# Patient Record
Sex: Female | Born: 1980 | Race: White | Hispanic: Yes | Marital: Single | Smoking: Former smoker
Health system: Southern US, Community
[De-identification: ages and names within clinical notes are randomized; demographics above are authoritative.]

## PROBLEM LIST (undated history)

## (undated) ENCOUNTER — Emergency Department (HOSPITAL_COMMUNITY): Payer: Medicaid Other | Source: Home / Self Care

## (undated) DIAGNOSIS — E7800 Pure hypercholesterolemia, unspecified: Secondary | ICD-10-CM

## (undated) DIAGNOSIS — I10 Essential (primary) hypertension: Secondary | ICD-10-CM

## (undated) DIAGNOSIS — A048 Other specified bacterial intestinal infections: Secondary | ICD-10-CM

## (undated) DIAGNOSIS — E781 Pure hyperglyceridemia: Secondary | ICD-10-CM

## (undated) DIAGNOSIS — T7840XA Allergy, unspecified, initial encounter: Secondary | ICD-10-CM

## (undated) HISTORY — PX: OTHER SURGICAL HISTORY: SHX169

## (undated) HISTORY — DX: Other specified bacterial intestinal infections: A04.8

## (undated) HISTORY — DX: Allergy, unspecified, initial encounter: T78.40XA

## (undated) HISTORY — DX: Pure hyperglyceridemia: E78.1

---

## 2003-12-01 ENCOUNTER — Emergency Department (HOSPITAL_COMMUNITY): Admission: EM | Admit: 2003-12-01 | Discharge: 2003-12-01 | Payer: Self-pay | Admitting: Emergency Medicine

## 2008-05-10 ENCOUNTER — Emergency Department (HOSPITAL_COMMUNITY): Admission: EM | Admit: 2008-05-10 | Discharge: 2008-05-10 | Payer: Self-pay | Admitting: Emergency Medicine

## 2010-03-05 ENCOUNTER — Inpatient Hospital Stay (HOSPITAL_COMMUNITY)
Admission: AD | Admit: 2010-03-05 | Discharge: 2010-03-05 | Payer: Self-pay | Source: Home / Self Care | Admitting: Family Medicine

## 2010-03-21 ENCOUNTER — Inpatient Hospital Stay (HOSPITAL_COMMUNITY)
Admission: RE | Admit: 2010-03-21 | Discharge: 2010-03-21 | Payer: Self-pay | Source: Home / Self Care | Admitting: Obstetrics & Gynecology

## 2010-03-31 ENCOUNTER — Ambulatory Visit: Payer: Self-pay | Admitting: Obstetrics & Gynecology

## 2010-03-31 LAB — CONVERTED CEMR LAB
Antibody Screen: NEGATIVE
Rh Type: POSITIVE

## 2010-04-01 ENCOUNTER — Encounter: Payer: Self-pay | Admitting: Obstetrics & Gynecology

## 2010-04-01 LAB — CONVERTED CEMR LAB

## 2010-04-14 ENCOUNTER — Ambulatory Visit: Payer: Self-pay | Admitting: Family Medicine

## 2010-04-18 ENCOUNTER — Ambulatory Visit (HOSPITAL_COMMUNITY)
Admission: RE | Admit: 2010-04-18 | Discharge: 2010-04-18 | Payer: Self-pay | Source: Home / Self Care | Attending: Obstetrics & Gynecology | Admitting: Obstetrics & Gynecology

## 2010-05-05 ENCOUNTER — Ambulatory Visit: Payer: Self-pay | Admitting: Obstetrics and Gynecology

## 2010-05-19 ENCOUNTER — Ambulatory Visit
Admission: RE | Admit: 2010-05-19 | Discharge: 2010-05-19 | Payer: Self-pay | Source: Home / Self Care | Attending: Obstetrics & Gynecology | Admitting: Obstetrics & Gynecology

## 2010-05-21 LAB — POCT URINALYSIS DIPSTICK
Bilirubin Urine: NEGATIVE
Hgb urine dipstick: NEGATIVE
Ketones, ur: NEGATIVE mg/dL
Nitrite: NEGATIVE
Protein, ur: NEGATIVE mg/dL
Specific Gravity, Urine: 1.025 (ref 1.005–1.030)
Urine Glucose, Fasting: NEGATIVE mg/dL
Urobilinogen, UA: 0.2 mg/dL (ref 0.0–1.0)
pH: 7 (ref 5.0–8.0)

## 2010-06-02 ENCOUNTER — Encounter (INDEPENDENT_AMBULATORY_CARE_PROVIDER_SITE_OTHER): Payer: Self-pay | Admitting: *Deleted

## 2010-06-02 ENCOUNTER — Ambulatory Visit
Admission: RE | Admit: 2010-06-02 | Discharge: 2010-06-02 | Payer: Self-pay | Source: Home / Self Care | Attending: Obstetrics and Gynecology | Admitting: Obstetrics and Gynecology

## 2010-06-02 DIAGNOSIS — O26879 Cervical shortening, unspecified trimester: Secondary | ICD-10-CM

## 2010-06-02 LAB — POCT URINALYSIS DIPSTICK
Bilirubin Urine: NEGATIVE
Hgb urine dipstick: NEGATIVE
Ketones, ur: NEGATIVE mg/dL
Nitrite: NEGATIVE
Protein, ur: NEGATIVE mg/dL
Specific Gravity, Urine: 1.02 (ref 1.005–1.030)
Urine Glucose, Fasting: NEGATIVE mg/dL
Urobilinogen, UA: 0.2 mg/dL (ref 0.0–1.0)
pH: 7 (ref 5.0–8.0)

## 2010-06-02 LAB — CONVERTED CEMR LAB
Chlamydia, DNA Probe: NEGATIVE
GC Probe Amp, Genital: NEGATIVE
Trich, Wet Prep: NONE SEEN
Yeast Wet Prep HPF POC: NONE SEEN

## 2010-06-09 ENCOUNTER — Other Ambulatory Visit: Payer: Self-pay

## 2010-06-09 DIAGNOSIS — O26879 Cervical shortening, unspecified trimester: Secondary | ICD-10-CM

## 2010-06-09 DIAGNOSIS — IMO0002 Reserved for concepts with insufficient information to code with codable children: Secondary | ICD-10-CM

## 2010-06-10 ENCOUNTER — Inpatient Hospital Stay (HOSPITAL_COMMUNITY)
Admission: AD | Admit: 2010-06-10 | Discharge: 2010-06-13 | DRG: 775 | Disposition: A | Discharge status: Home / Self Care | Payer: Medicaid Other | Source: Ambulatory Visit | Attending: Obstetrics & Gynecology | Admitting: Obstetrics & Gynecology

## 2010-06-10 DIAGNOSIS — O4290 Premature rupture of membranes, unspecified as to length of time between rupture and onset of labor, unspecified weeks of gestation: Principal | ICD-10-CM | POA: Diagnosis present

## 2010-06-10 LAB — CBC
HCT: 38.2 % (ref 36.0–46.0)
Hemoglobin: 13.2 g/dL (ref 12.0–15.0)
MCH: 29.7 pg (ref 26.0–34.0)
MCHC: 34.6 g/dL (ref 30.0–36.0)
MCV: 86 fL (ref 78.0–100.0)
Platelets: 223 10*3/uL (ref 150–400)
RBC: 4.44 MIL/uL (ref 3.87–5.11)
RDW: 13.8 % (ref 11.5–15.5)
WBC: 9 10*3/uL (ref 4.0–10.5)

## 2010-06-11 DIAGNOSIS — O4290 Premature rupture of membranes, unspecified as to length of time between rupture and onset of labor, unspecified weeks of gestation: Secondary | ICD-10-CM

## 2010-06-11 LAB — RPR: RPR Ser Ql: NONREACTIVE

## 2010-06-11 LAB — HEPATITIS B SURFACE ANTIGEN: Hepatitis B Surface Ag: NEGATIVE

## 2010-07-09 ENCOUNTER — Encounter: Payer: Self-pay | Admitting: Obstetrics and Gynecology

## 2010-07-09 ENCOUNTER — Ambulatory Visit (INDEPENDENT_AMBULATORY_CARE_PROVIDER_SITE_OTHER): Payer: Self-pay | Admitting: Obstetrics and Gynecology

## 2010-07-09 LAB — CONVERTED CEMR LAB
HCT: 42.1 % (ref 36.0–46.0)
Hemoglobin: 14.6 g/dL (ref 12.0–15.0)
MCHC: 34.7 g/dL (ref 30.0–36.0)
MCV: 83.9 fL (ref 78.0–100.0)
Platelets: 301 10*3/uL (ref 150–400)
RBC: 5.02 M/uL (ref 3.87–5.11)
RDW: 13.3 % (ref 11.5–15.5)
WBC: 8.9 10*3/uL (ref 4.0–10.5)

## 2010-07-14 LAB — POCT URINALYSIS DIPSTICK
Bilirubin Urine: NEGATIVE
Glucose, UA: NEGATIVE mg/dL
Ketones, ur: NEGATIVE mg/dL
Nitrite: NEGATIVE
Protein, ur: NEGATIVE mg/dL
Specific Gravity, Urine: 1.025 (ref 1.005–1.030)
Urobilinogen, UA: 0.2 mg/dL (ref 0.0–1.0)
pH: 7 (ref 5.0–8.0)

## 2010-07-15 LAB — URINALYSIS, ROUTINE W REFLEX MICROSCOPIC
Bilirubin Urine: NEGATIVE
Glucose, UA: NEGATIVE mg/dL
Hgb urine dipstick: NEGATIVE
Ketones, ur: NEGATIVE mg/dL
Nitrite: NEGATIVE
Protein, ur: NEGATIVE mg/dL
Specific Gravity, Urine: 1.01 (ref 1.005–1.030)
Urobilinogen, UA: 0.2 mg/dL (ref 0.0–1.0)
pH: 7 (ref 5.0–8.0)

## 2010-07-15 LAB — POCT URINALYSIS DIPSTICK
Bilirubin Urine: NEGATIVE
Bilirubin Urine: NEGATIVE
Glucose, UA: NEGATIVE mg/dL
Glucose, UA: NEGATIVE mg/dL
Hgb urine dipstick: NEGATIVE
Hgb urine dipstick: NEGATIVE
Ketones, ur: NEGATIVE mg/dL
Ketones, ur: NEGATIVE mg/dL
Nitrite: NEGATIVE
Nitrite: NEGATIVE
Protein, ur: NEGATIVE mg/dL
Protein, ur: NEGATIVE mg/dL
Specific Gravity, Urine: 1.025 (ref 1.005–1.030)
Specific Gravity, Urine: 1.02 (ref 1.005–1.030)
Urobilinogen, UA: 0.2 mg/dL (ref 0.0–1.0)
Urobilinogen, UA: 0.2 mg/dL (ref 0.0–1.0)
pH: 6 (ref 5.0–8.0)
pH: 7 (ref 5.0–8.0)

## 2010-07-15 LAB — WET PREP, GENITAL
Trich, Wet Prep: NONE SEEN
Yeast Wet Prep HPF POC: NONE SEEN

## 2010-07-15 LAB — FETAL FIBRONECTIN: Fetal Fibronectin: NEGATIVE

## 2012-11-04 IMAGING — US US OB TRANSVAGINAL
1 series · 10 of 10 positions shown · non-contrast
Comparison: none

[Series 1: us ob transvaginal · 10 of 10 slices shown]
[im 1/10]
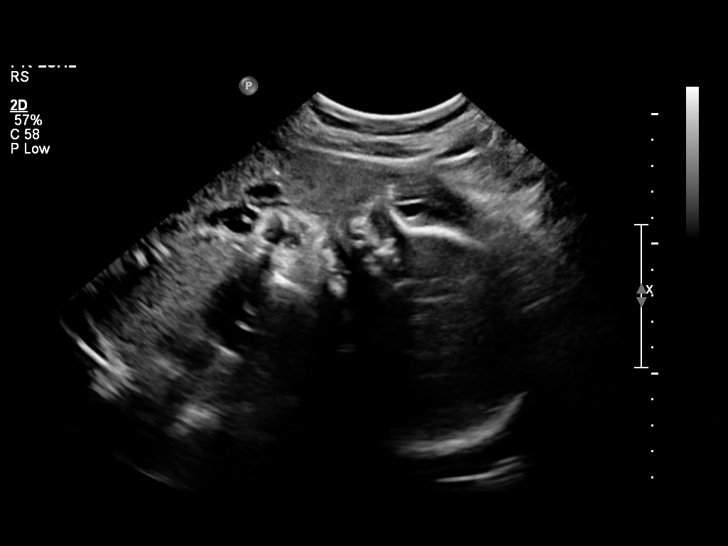
[im 2/10]
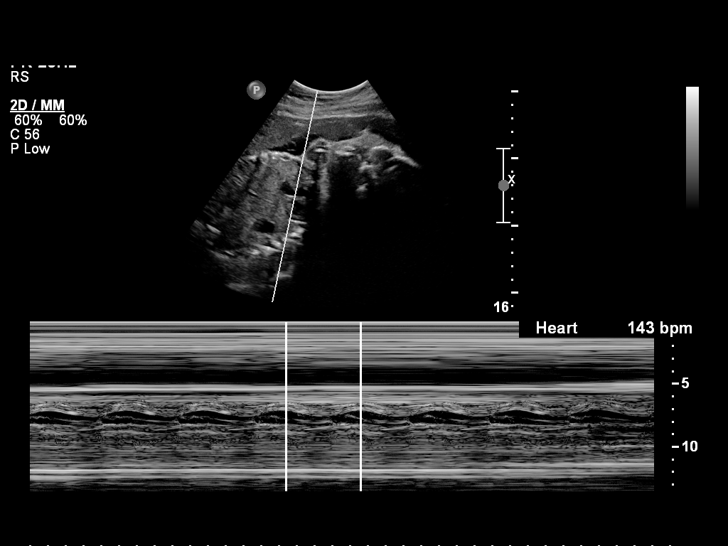
[im 3/10]
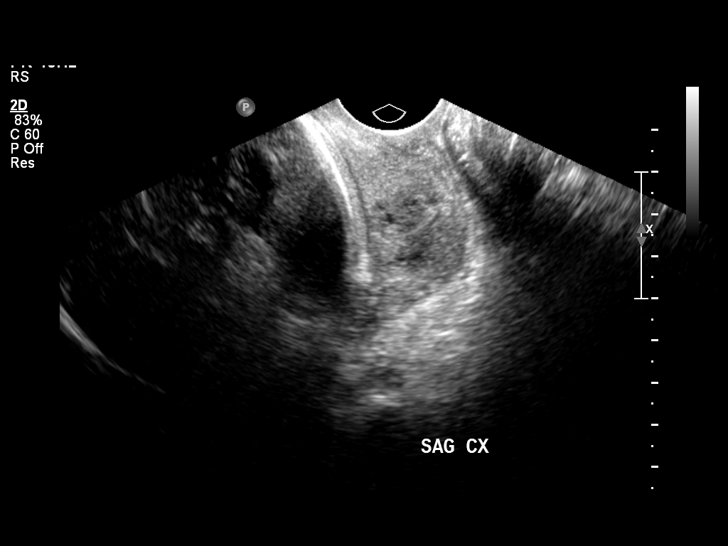
[im 4/10]
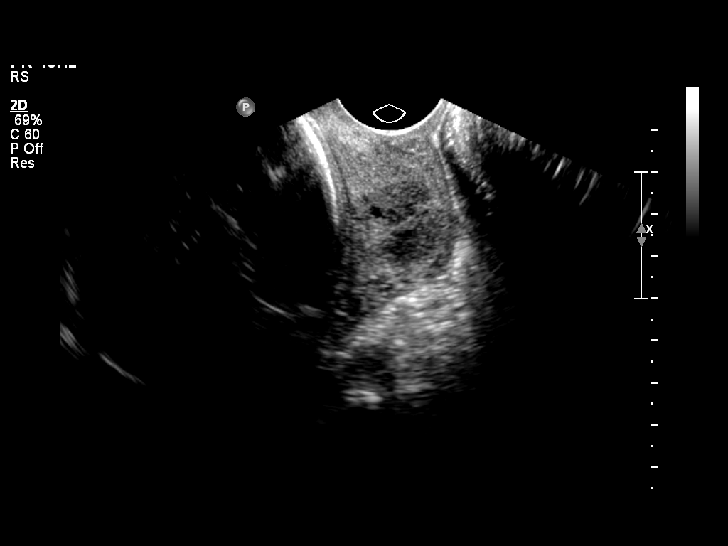
[im 5/10]
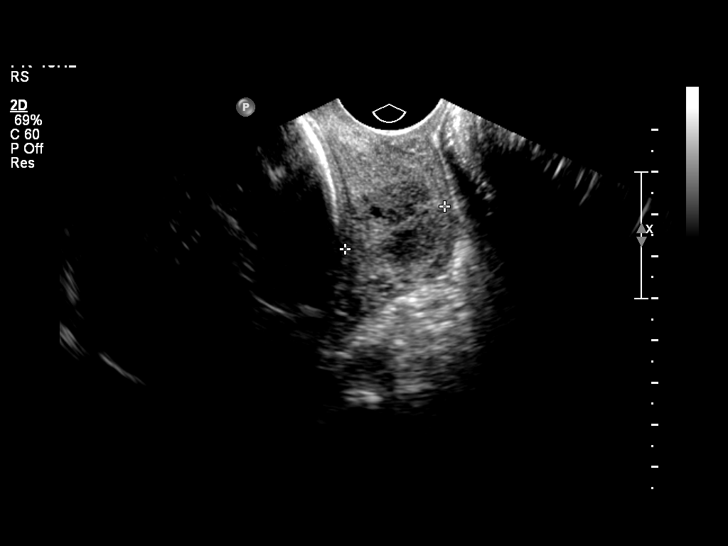
[im 6/10]
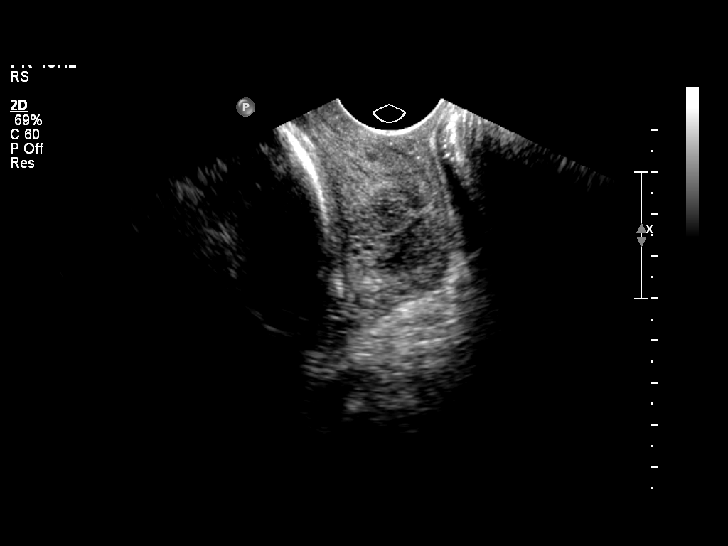
[im 7/10]
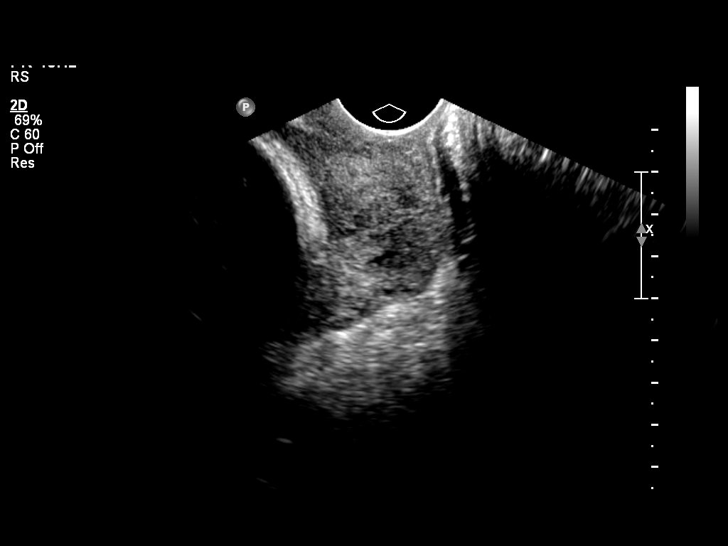
[im 8/10]
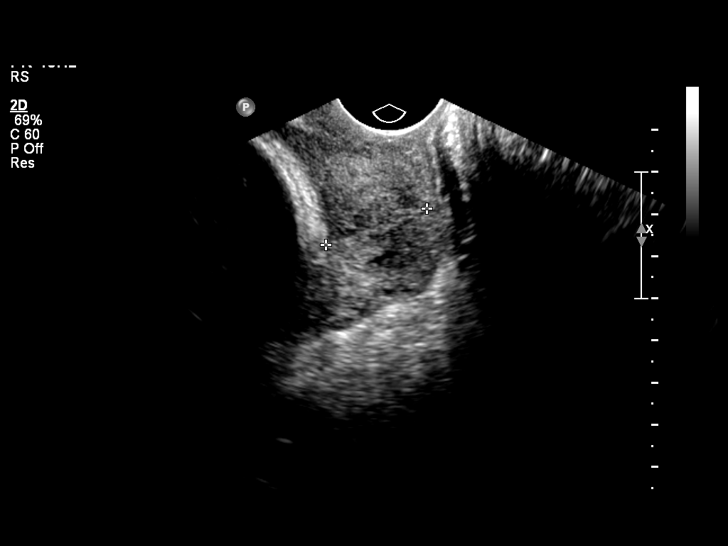
[im 9/10]
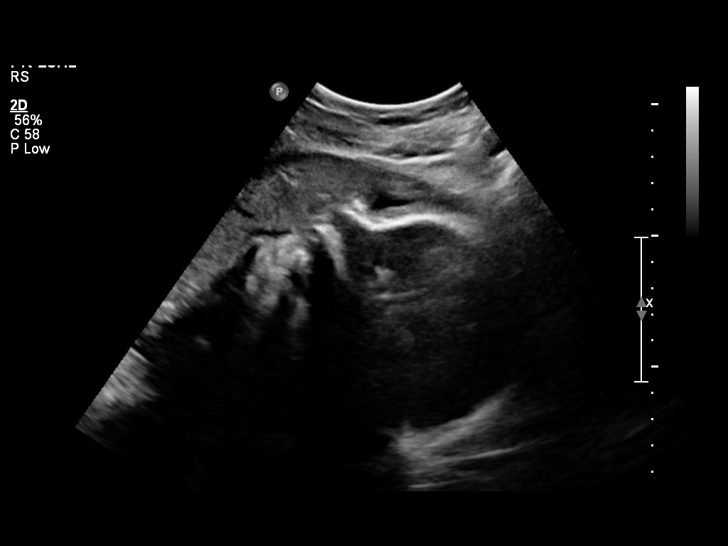
[im 10/10]
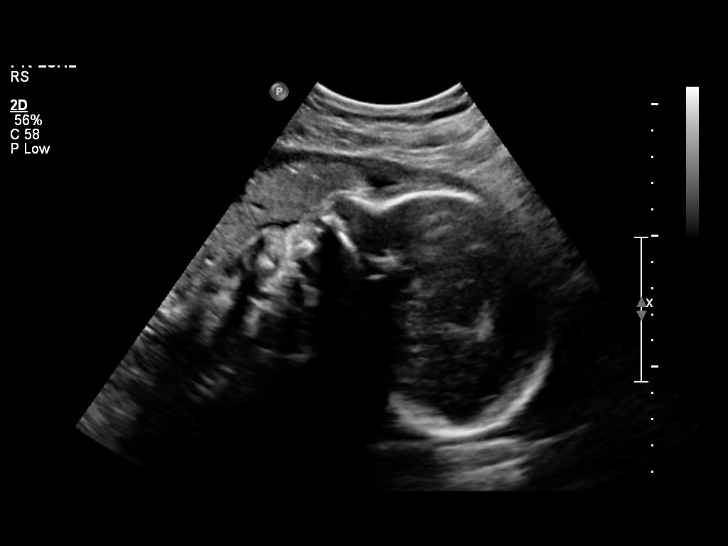

[10 of 10 positions shown; findings below may reference images not displayed]

OBSTETRICS REPORT
                      (Signed Final 04/18/2010 [DATE])

           FERIENHAUS

                 Kema Hoo
                 Faculty Physician
 Order#:         _[id number]
Procedures

 US OB TRANSVAGINAL                                    [id number]
Indications

 Cervical shortening
 Assess cervical length
Fetal Evaluation

 Fetal Heart Rate:  143                          bpm
 Cardiac Activity:  Observed
 Presentation:      Cephalic
Gestational Age

 LMP:           30w 5d        Date:  09/15/09                 EDD:   06/22/10
 Best:          29w 6d     Det. By:  U/S (03/21/10)           EDD:   06/28/10
Cervix Uterus Adnexa

 Cervical Length:    2.6      cm

 Cervix:       Closed. Measured transvaginally. No funneling
Impression

 Cervical shortening with length as noted above. No
 associated funneling noted today.

## 2015-10-01 ENCOUNTER — Ambulatory Visit: Payer: Medicaid Other | Attending: Internal Medicine

## 2015-10-05 ENCOUNTER — Ambulatory Visit (INDEPENDENT_AMBULATORY_CARE_PROVIDER_SITE_OTHER): Payer: Self-pay | Admitting: Physician Assistant

## 2015-10-05 VITALS — BP 118/76 | HR 70 | Temp 98.0°F | Resp 17 | Ht 60.0 in | Wt 180.2 lb

## 2015-10-05 DIAGNOSIS — Z79899 Other long term (current) drug therapy: Secondary | ICD-10-CM

## 2015-10-05 DIAGNOSIS — M549 Dorsalgia, unspecified: Secondary | ICD-10-CM

## 2015-10-05 DIAGNOSIS — R202 Paresthesia of skin: Secondary | ICD-10-CM

## 2015-10-05 DIAGNOSIS — G5603 Carpal tunnel syndrome, bilateral upper limbs: Secondary | ICD-10-CM

## 2015-10-05 LAB — LIPID PANEL
Cholesterol: 223 mg/dL — ABNORMAL HIGH (ref 125–200)
HDL: 69 mg/dL (ref >=46)
LDL Cholesterol: 128 mg/dL (ref <130)
Total CHOL/HDL Ratio: 3.2 Ratio (ref <=5.0)
Triglycerides: 131 mg/dL (ref <150)
VLDL: 26 mg/dL (ref <30)

## 2015-10-05 LAB — GLUCOSE, POCT (MANUAL RESULT ENTRY): POC Glucose: 80 mg/dl (ref 70–99)

## 2015-10-05 MED ORDER — MELOXICAM 7.5 MG PO TABS: 7.5000 mg | ORAL_TABLET | Freq: Every day | ORAL | Status: DC

## 2015-10-05 MED ORDER — PREDNISONE 20 MG PO TABS
ORAL_TABLET | ORAL | Status: DC
Start: 2015-10-05 — End: 2017-07-18

## 2015-10-05 NOTE — Patient Instructions (Addendum)
     IF you received an x-ray today, you will receive an invoice from [facility]. Please contact [facility] at [phone] with questions or concerns regarding your invoice.   IF you received labwork today, you will receive an invoice from [facility]. Please contact [facility] at [phone] with questions or concerns regarding your invoice.   Our billing staff will not be able to assist you with questions regarding bills from these companies.  You will be contacted with the lab results as soon as they are available. The fastest way to get your results is to activate your My Chart account. Instructions are located on the last page of this paperwork. If you have not heard from us regarding the results in 2 weeks, please contact this office.    Please get splints that you will wear every night.  Please wear it during the day as well.  I would like you to hydrate well. Do not take ibuprofen or napoxen with the mobic.  You can take tylenol.   Sndrome del tnel carpiano (Carpal Tunnel Syndrome) El sndrome del tnel carpiano es una afeccin que causa dolor en la mano y en el brazo. El tnel carpiano es un espacio estrecho ubicado en el lado palmar de la Davismueca. Los movimientos repetidos de la mueca o determinadas enfermedades pueden causar la hinchazn del tnel. Esta hinchazn puede comprimir el nervio principal de la mueca (nervio mediano).  CUIDADOS EN EL HOGAR Si tiene una frula:  sela como se lo haya indicado el mdico. Qutesela solamente como se lo haya indicado el mdico.  Afloje la frula si los dedos:  Se le adormecen y siente hormigueos.  Se le ponen azulados y fros.  Mantenga la frula limpia y seca. Instrucciones generales  [employer] los medicamentos de venta libre y los recetados solamente como se lo haya indicado el mdico.  Haga reposar la [place] de toda actividad que le cause dolor. Si es necesario, hable con su empleador  United Stationerssobre los cambios que pueden hacerse en su lugar de [place]atrabajo, por ejemplo, usar una almohadilla para apoyar la mueca mientras tipea.  Si se lo indican, aplique hielo sobre la zona dolorida:  Ponga el hielo en una bolsa plstica.  Coloque una toalla entre la piel y la bolsa de hielo.  Coloque el hielo durante 20minutos, 2 a 3veces por Futures traderda.  Concurra a todas las visitas de control como se lo haya indicado el mdico. Esto es importante.  Haga los ejercicios como se lo hayan indicado el mdico, el fisioterapeuta o el terapeuta ocupacional. SOLICITE AYUDA SI:  Aparecen nuevos sntomas.  Los medicamentos no Tourist information centre managerle alivian el dolor.  Los sntomas empeoran.   Esta informacin no tiene Theme park managercomo fin reemplazar el consejo del mdico. Asegrese de hacerle al mdico cualquier pregunta que tenga.   Document Released: 04/09/2011 Document Revised: 01/09/2015 Elsevier Interactive Patient Education [employer]2016 Elsevier Inc.

## 2015-10-05 NOTE — Progress Notes (Signed)
[facility] [address], [place] [id number] [phone]  Date:  10/05/2015   Name:  Tracy StagerFlor A GODINEZHERNANDE   DOB:  10/18/1980   MRN:  [id number]  PCP:  No primary care provider on file.    History of Present Illness:  Tracy StagerFlor A GODINEZHERNANDE is a 35 y.o. female patient who [facility]    --Cramps of both of her hands of her middle fingers.  3 weeks.  Cramping and tingling.  5 times per day just about.  She will be at work when it is aggravated.  She is at work when it flares up.  She will also have some numbness of her hands.  No hx of diabetes.  No swelling.  No movement aggravates that she knows of.    --Low back pain that radiates up her back.  For about 3 weeks.  No trauma, lifting that she can recall.  Radiates up her back.  Nothing in lower recall.  Less bowel movements.  No constipation or diarrhea.  No dysuria, hematuria, or frequency.  No problem with her thyroid.         There are no active problems to display for this patient.   No past medical history on file.  No past surgical history on file.  Social History  Substance Use Topics  . Smoking status: Never Smoker   . Smokeless tobacco: Not on file  . Alcohol Use: Not on file    No family history on file.  No Known Allergies  Medication list has been reviewed and updated.  No current outpatient prescriptions on file prior to visit.   No current facility-administered medications on file prior to visit.    ROS ROS otherwie unremarkable unless listed above.   Physical Examination: BP 118/76 mmHg  Pulse 70  Temp(Src) 98 F (36.7 C) (Oral)  Resp 17  Ht 5' (1.524 m)  Wt 180 lb 3.2 oz (81.738 kg)  BMI 35.19 kg/m2  SpO2 99%  LMP 09/12/2015 Ideal Body Weight: Weight in (lb) to have BMI = 25: 127.7  Physical Exam  Constitutional: She is oriented to person, place, and time. She appears well-developed and well-nourished. No distress.  HENT:  Head: Normocephalic and  atraumatic.  Right Ear: External ear normal.  Left Ear: External ear normal.  Eyes: Conjunctivae and EOM are normal. Pupils are equal, round, and reactive to light.  Cardiovascular: Normal rate.   Pulmonary/Chest: Effort normal. No respiratory distress.  Musculoskeletal:       Lumbar back: She exhibits tenderness (lower adjacent musculature bilaterally.). She exhibits normal range of motion, no bony tenderness and no swelling.  Erythema or swelling to the extremities. Positive Phalen's and Tinel's bilaterally. Normal range of motion bilaterally. Normal strength with resistance.  Neurological: She is alert and oriented to person, place, and time.  Skin: She is not diaphoretic.  Psychiatric: She has a normal mood and affect. Her behavior is normal.     Assessment and Plan: Tracy StagerFlor A GODINEZHERNANDE is a 35 y.o. female who is here today for cc of hand pain.   Advised anti inflammatory, and steroid use.  Precautions advised.  Also advised to get wrist splints to wear at nighttime.    She will rtc if her symptoms do not improve.   Medication management - Plan: POCT glucose (manual entry), Lipid panel  Paresthesia of hand, bilateral - Plan: POCT glucose (manual entry), Lipid panel  Bilateral carpal tunnel syndrome  Back pain, unspecified location -  Plan: meloxicam (MOBIC) 7.5 MG tablet, predniSONE (DELTASONE) 20 MG tablet  Trena Platt, PA-C [facility] 10/05/2015 11:47 AM

## 2015-10-31 ENCOUNTER — Encounter: Payer: Self-pay | Admitting: Physician Assistant

## 2016-08-01 ENCOUNTER — Encounter (HOSPITAL_COMMUNITY): Payer: Self-pay | Admitting: Emergency Medicine

## 2016-08-01 ENCOUNTER — Emergency Department (HOSPITAL_COMMUNITY): Payer: Self-pay

## 2016-08-01 ENCOUNTER — Emergency Department (HOSPITAL_COMMUNITY)
Admission: EM | Admit: 2016-08-01 | Discharge: 2016-08-01 | Disposition: A | Discharge status: Home / Self Care | Payer: Self-pay | Attending: Emergency Medicine | Admitting: Emergency Medicine

## 2016-08-01 DIAGNOSIS — K5900 Constipation, unspecified: Secondary | ICD-10-CM | POA: Insufficient documentation

## 2016-08-01 HISTORY — DX: Pure hypercholesterolemia, unspecified: E78.00

## 2016-08-01 LAB — COMPREHENSIVE METABOLIC PANEL
ALT: 24 U/L (ref 14–54)
AST: 21 U/L (ref 15–41)
Albumin: 4.4 g/dL (ref 3.5–5.0)
Alkaline Phosphatase: 60 U/L (ref 38–126)
Anion gap: 7 (ref 5–15)
BUN: 7 mg/dL (ref 6–20)
CO2: 23 mmol/L (ref 22–32)
Calcium: 9.2 mg/dL (ref 8.9–10.3)
Chloride: 102 mmol/L (ref 101–111)
Creatinine, Ser: 0.63 mg/dL (ref 0.44–1.00)
GFR calc Af Amer: >60 mL/min (ref >60)
GFR calc non Af Amer: >60 mL/min (ref >60)
Glucose, Bld: 108 mg/dL — ABNORMAL HIGH (ref 65–99)
Potassium: 3.6 mmol/L (ref 3.5–5.1)
Sodium: 132 mmol/L — ABNORMAL LOW (ref 135–145)
Total Bilirubin: 0.7 mg/dL (ref 0.3–1.2)
Total Protein: 7.8 g/dL (ref 6.5–8.1)

## 2016-08-01 LAB — CBC WITH DIFFERENTIAL/PLATELET
Basophils Absolute: 0 10*3/uL (ref 0.0–0.1)
Basophils Relative: 0 %
Eosinophils Absolute: 0.1 10*3/uL (ref 0.0–0.7)
Eosinophils Relative: 1 %
HCT: 40.1 % (ref 36.0–46.0)
Hemoglobin: 13.5 g/dL (ref 12.0–15.0)
Lymphocytes Relative: 29 %
Lymphs Abs: 2.8 10*3/uL (ref 0.7–4.0)
MCH: 28.6 pg (ref 26.0–34.0)
MCHC: 33.7 g/dL (ref 30.0–36.0)
MCV: 85 fL (ref 78.0–100.0)
Monocytes Absolute: 0.6 10*3/uL (ref 0.1–1.0)
Monocytes Relative: 6 %
Neutro Abs: 6.4 10*3/uL (ref 1.7–7.7)
Neutrophils Relative %: 64 %
Platelets: 286 10*3/uL (ref 150–400)
RBC: 4.72 MIL/uL (ref 3.87–5.11)
RDW: 12.7 % (ref 11.5–15.5)
WBC: 9.9 10*3/uL (ref 4.0–10.5)

## 2016-08-01 LAB — I-STAT BETA HCG BLOOD, ED (MC, WL, AP ONLY): I-stat hCG, quantitative: <5 m[IU]/mL (ref <5)

## 2016-08-01 MED ORDER — MAGNESIUM CITRATE PO SOLN
1.0000 | Freq: Once | ORAL | Status: AC
  Administered 2016-08-01: 1 via ORAL
  Filled 2016-08-01: qty 296

## 2016-08-01 MED ORDER — POLYETHYLENE GLYCOL 3350 17 GM/SCOOP PO POWD: ORAL | 0 refills | Status: DC

## 2016-08-01 NOTE — ED Notes (Signed)
MD at bedside. 

## 2016-08-01 NOTE — ED Notes (Signed)
Patient transported to x-ray. ?

## 2016-08-01 NOTE — ED Notes (Signed)
Patient had large bowel movement in bedside commode, states she is feeling better following soap suds enema. MD notified.

## 2016-08-01 NOTE — ED Notes (Signed)
Pt verbalized understanding of discharge instructions and denies any further needs or questions at this time. VSS, pt ambulatory with steady gait.

## 2016-08-01 NOTE — ED Triage Notes (Signed)
Pt c/o not having a bowel movement for 8 days. Pt has tried miralax and suppositories without relief.

## 2016-08-01 NOTE — ED Provider Notes (Signed)
[facility] Provider Note   CSN: [id number] Arrival date & time: 08/01/16  1758     History   Chief Complaint Chief Complaint  Patient presents with  . Constipation    HPI Tracy Petty is a 36 y.o. female.  HPI  36 are old female with history of hyperlipidemia who presents with constipation. Patient reports inability to have a bowel movement for the last 8 days despite using a suppository as well as MiraLAX at home. Denies prior history of constipation or changes in her diet. Patient denies abdominal pain to me, but does report rectal pain when attempting to have a bowel movement. Denies blood in her stool or dark tarry stools. Denies difficulty urinating, dysuria, frequency. Reports some occasional low back pain that is worse with certain movements. No history of abdominal surgeries or intra-abdominal pathology including IBS, UC, or Crohn's.  Past Medical History:  Diagnosis Date  . High cholesterol     There are no active problems to display for this patient.   History reviewed. No pertinent surgical history.  OB History    No data available       Home Medications    Prior to Admission medications   Medication Sig Start Date End Date Taking? Authorizing Provider  meloxicam (MOBIC) 7.5 MG tablet Take 1 tablet (7.5 mg total) by mouth daily. 10/05/15   Collie Siad English, PA  polyethylene glycol powder (MIRALAX) powder Place 1 capfull of powder in 8 oz of water or gatorade and consume daily. If you are unable to have a bowel movement, increase dosage to 2 capfulls daily. Do not use for more than 1 week without consulting your doctor. 08/01/16   Maribel Hadley Ernestina Penna, MD  predniSONE (DELTASONE) 20 MG tablet Take 3 PO QAM x3days, 2 PO QAM x2days, 1 PO QAM x3days 10/05/15   Garnetta Buddy, PA    Family History No family history on file.  Social History Social History  Substance Use Topics  . Smoking status: Never Smoker  . Smokeless tobacco: Never  Used  . Alcohol use 0.0 oz/week     Allergies   Patient has no known allergies.   Review of Systems Review of Systems  Constitutional: Negative for chills and fever.  HENT: Negative for congestion.   Eyes: Negative for visual disturbance.  Respiratory: Negative for cough and shortness of breath.   Cardiovascular: Negative for chest pain and palpitations.  Gastrointestinal: Positive for constipation and rectal pain (when attempting to have a bowel movement). Negative for abdominal distention, abdominal pain, blood in stool, diarrhea, nausea and vomiting.  Genitourinary: Negative for dysuria, flank pain, frequency, hematuria and pelvic pain.  Musculoskeletal: Positive for back pain. Negative for arthralgias, joint swelling, myalgias, neck pain and neck stiffness.  Skin: Negative for rash.  Neurological: Negative for dizziness, weakness, light-headedness and headaches.  Psychiatric/Behavioral: Negative for agitation, behavioral problems and confusion.     Physical Exam Updated Vital Signs BP (!) 151/90 (BP Location: Left Arm)   Pulse 84   Temp 98.4 F (36.9 C) (Oral)   Resp 16   Ht  (1.473 m)   Wt 72.2 kg   LMP 07/24/2016   SpO2 100%   BMI 33.28 kg/m   Physical Exam  Constitutional: She is oriented to person, place, and time. She appears well-developed and well-nourished. No distress.  HENT:  Head: Normocephalic and atraumatic.  Eyes: Conjunctivae are normal.  Neck: Neck supple.  Cardiovascular: Normal rate and regular rhythm.   No  murmur heard. Pulmonary/Chest: Effort normal and breath sounds normal. No respiratory distress.  Abdominal: Soft. Bowel sounds are normal. She exhibits no distension. There is tenderness (mild, generalized).  Genitourinary:  Genitourinary Comments: Rectal vault distended with firm brown stool. No gross blood. No anal fissures, hemorrhoids, or masses.   Musculoskeletal: She exhibits no edema.  Neurological: She is alert and oriented to  person, place, and time. She exhibits normal muscle tone.  Skin: Skin is warm and dry.  Psychiatric: She has a normal mood and affect.  Nursing note and vitals reviewed.    ED Treatments / Results  Labs (all labs ordered are listed, but only abnormal results are displayed) Labs Reviewed  COMPREHENSIVE METABOLIC PANEL - Abnormal; Notable for the following:       Result Value   Sodium 132 (*)    Glucose, Bld 108 (*)    All other components within normal limits  CBC WITH DIFFERENTIAL/PLATELET  I-STAT BETA HCG BLOOD, ED ([facility], [facility], [facility] ONLY)    EKG  EKG Interpretation None       Radiology Dg Abdomen Acute W/chest  Result Date: 08/01/2016 CLINICAL DATA:  Constipation EXAM: DG ABDOMEN ACUTE W/ 1V CHEST COMPARISON:  None. FINDINGS: Normal heart size. Normal mediastinal contour. No pneumothorax. No pleural effusion. Lungs appear clear, with no acute consolidative airspace disease and no pulmonary edema. No dilated small bowel loops or air-fluid levels. Mild to moderate colorectal stool volume. No evidence of pneumatosis or pneumoperitoneum. No radiopaque urolithiasis. IMPRESSION: 1. No active disease in the chest . 2. Nonobstructive bowel gas pattern. 3. Mild to moderate colorectal stool volume. Electronically Signed   By: Delbert Phenix M.D.   On: 08/01/2016 19:30    Procedures Procedures (including critical care time)  Medications Ordered in ED Medications  magnesium citrate solution 1 Bottle (1 Bottle Oral Given 08/01/16 1928)     Initial Impression / Assessment and Plan / ED Course  I have reviewed the triage vital signs and the nursing notes.  Pertinent labs & imaging results that were available during my care of the patient were reviewed by me and considered in my medical decision making (see chart for details).     Generally well appearing. Afebrile and hemodynamically stable. Patient has mild generalized tenderness to palpation of her abdomen, though exam is overall benign.  CBC with no leukocytosis and normal hemoglobin. CMP unremarkable. X-ray of the abdomen with moderate colorectal stool volume and nonobstructive bowel gas pattern. Rectal vault is distended with firm brown stool. Small amount of stool disimpacted, and patient given soapsuds enema and magnesium citrate, with subsequent production of a large amount of stool. Patient reports feeling much better after having a bowel movement. Abdomen remains soft.   Will discharge home with prescription for MiraLAX for 1 week. Patient states she can follow-up with her primary care physician for further management.  Care of patient overseen by my attending, Dr. Lynelle Doctor.  Final Clinical Impressions(s) / ED Diagnoses   Final diagnoses:  Constipation, unspecified constipation type    New Prescriptions New Prescriptions   POLYETHYLENE GLYCOL POWDER (MIRALAX) POWDER    Place 1 capfull of powder in 8 oz of water or gatorade and consume daily. If you are unable to have a bowel movement, increase dosage to 2 capfulls daily. Do not use for more than 1 week without consulting your doctor.     Lauriana Denes Ernestina Penna, MD 08/01/16 2018    Linwood Dibbles, MD 08/03/16 [phone]

## 2017-01-02 ENCOUNTER — Emergency Department (HOSPITAL_COMMUNITY)
Admission: EM | Admit: 2017-01-02 | Discharge: 2017-01-02 | Disposition: A | Discharge status: Home / Self Care | Payer: Self-pay | Attending: Emergency Medicine | Admitting: Emergency Medicine

## 2017-01-02 ENCOUNTER — Encounter (HOSPITAL_COMMUNITY): Payer: Self-pay | Admitting: Emergency Medicine

## 2017-01-02 DIAGNOSIS — Z79899 Other long term (current) drug therapy: Secondary | ICD-10-CM | POA: Insufficient documentation

## 2017-01-02 DIAGNOSIS — K2970 Gastritis, unspecified, without bleeding: Secondary | ICD-10-CM | POA: Insufficient documentation

## 2017-01-02 DIAGNOSIS — B9681 Helicobacter pylori [H. pylori] as the cause of diseases classified elsewhere: Secondary | ICD-10-CM | POA: Insufficient documentation

## 2017-01-02 LAB — COMPREHENSIVE METABOLIC PANEL
ALBUMIN: 4 g/dL (ref 3.5–5.0)
ALT: 18 U/L (ref 14–54)
ANION GAP: 8 (ref 5–15)
AST: 19 U/L (ref 15–41)
Alkaline Phosphatase: 49 U/L (ref 38–126)
BUN: 12 mg/dL (ref 6–20)
CO2: 23 mmol/L (ref 22–32)
Calcium: 8.6 mg/dL — ABNORMAL LOW (ref 8.9–10.3)
Chloride: 105 mmol/L (ref 101–111)
Creatinine, Ser: 0.67 mg/dL (ref 0.44–1.00)
GFR calc Af Amer: >60 mL/min (ref >60)
GFR calc non Af Amer: >60 mL/min (ref >60)
GLUCOSE: 105 mg/dL — AB (ref 65–99)
POTASSIUM: 3.4 mmol/L — AB (ref 3.5–5.1)
SODIUM: 136 mmol/L (ref 135–145)
TOTAL PROTEIN: 7.3 g/dL (ref 6.5–8.1)
Total Bilirubin: 0.5 mg/dL (ref 0.3–1.2)

## 2017-01-02 LAB — URINALYSIS, ROUTINE W REFLEX MICROSCOPIC
BILIRUBIN URINE: NEGATIVE
GLUCOSE, UA: NEGATIVE mg/dL
Hgb urine dipstick: NEGATIVE
Ketones, ur: NEGATIVE mg/dL
Leukocytes, UA: NEGATIVE
NITRITE: NEGATIVE
PH: 6 (ref 5.0–8.0)
Protein, ur: NEGATIVE mg/dL
SPECIFIC GRAVITY, URINE: 1.01 (ref 1.005–1.030)

## 2017-01-02 LAB — LIPASE, BLOOD: Lipase: 42 U/L (ref 11–51)

## 2017-01-02 LAB — CBC
HEMATOCRIT: 36.7 % (ref 36.0–46.0)
HEMOGLOBIN: 12.6 g/dL (ref 12.0–15.0)
MCH: 28.4 pg (ref 26.0–34.0)
MCHC: 34.3 g/dL (ref 30.0–36.0)
MCV: 82.7 fL (ref 78.0–100.0)
Platelets: 298 10*3/uL (ref 150–400)
RBC: 4.44 MIL/uL (ref 3.87–5.11)
RDW: 12.9 % (ref 11.5–15.5)
WBC: 9.1 10*3/uL (ref 4.0–10.5)

## 2017-01-02 LAB — I-STAT BETA HCG BLOOD, ED (MC, WL, AP ONLY): I-stat hCG, quantitative: <5 m[IU]/mL (ref <5)

## 2017-01-02 LAB — I-STAT TROPONIN, ED: Troponin i, poc: 0.01 ng/mL (ref 0.00–0.08)

## 2017-01-02 MED ORDER — AMOXICILLIN 500 MG PO CAPS: 500.0000 mg | ORAL_CAPSULE | Freq: Three times a day (TID) | ORAL | 0 refills | Status: AC

## 2017-01-02 NOTE — ED Triage Notes (Signed)
Pt arrives to ED with lower abd pain and pain in flank area. Pt was given antibiotics  recently but unable to get them filled pt states they were not at pharmacy  for her.

## 2017-01-02 NOTE — ED Provider Notes (Signed)
[facility] Provider Note   CSN: [id number] Arrival date & time: 01/02/17  1522   History   Chief Complaint Chief Complaint  Patient presents with  . Abdominal Pain  . Flank Pain    HPI Tracy Petty is a 36 y.o. female.  This is a 36 year old Hispanic female with PMH of recently diagnosed gastritis likely secondary to H pylori.  The patient states she has been taking clarithromycin and omeprazole since Wednesday however stated she has not been able to receive her third prescription, amoxicillin.  She presents today with complaints of chest pain which is tender to palpation and worse with eating.  She states this is been ongoing for the past several weeks.  She is originally from [place] but is lived in [place] years.  She denies any nausea or vomiting, she states she is not currently having any chest pain although the chest is tender to palpation.  She denies any urinary symptoms, denies any changes in bowel movements, endorses epigastric abdominal pain with meals.  Denies any fevers, night sweats, chills, dyspnea.  Denies any blurred vision, numbness or tingling in her extremities.  She has no family history of heart disease, denies personal history of heart disease.  She has borderline high cholesterol which she takes omega vitamins.  She denies any history of hypertension or diabetes which she states she was recently tested for.      Past Medical History:  Diagnosis Date  . High cholesterol     There are no active problems to display for this patient.   History reviewed. No pertinent surgical history.  OB History    No data available       Home Medications    Prior to Admission medications   Medication Sig Start Date End Date Taking? Authorizing Provider  amoxicillin (AMOXIL) 500 MG capsule Take 1 capsule (500 mg total) by mouth 3 (three) times daily. 01/02/17 01/16/17  Shaune PollackBriggs, Maika Mcelveen, MD  meloxicam (MOBIC) 7.5 MG tablet Take 1 tablet (7.5  mg total) by mouth daily. 10/05/15   Trena PlattEnglish, Stephanie D, PA  polyethylene glycol powder (MIRALAX) powder Place 1 capfull of powder in 8 oz of water or gatorade and consume daily. If you are unable to have a bowel movement, increase dosage to 2 capfulls daily. Do not use for more than 1 week without consulting your doctor. 08/01/16   Charlie PitterIrick, Jenifer Brunno, MD  predniSONE (DELTASONE) 20 MG tablet Take 3 PO QAM x3days, 2 PO QAM x2days, 1 PO QAM x3days 10/05/15   Trena PlattEnglish, Stephanie D, PA    Family History No family history on file.  Social History Social History  Substance Use Topics  . Smoking status: Never Smoker  . Smokeless tobacco: Never Used  . Alcohol use 0.0 oz/week     Allergies   Patient has no known allergies.   Review of Systems Review of Systems  Constitutional: Negative for chills, diaphoresis and fever.  HENT: Negative for ear pain and sore throat.   Eyes: Negative for pain and visual disturbance.  Respiratory: Negative for cough, chest tightness, shortness of breath and wheezing.   Cardiovascular: Positive for chest pain. Negative for palpitations and leg swelling.  Gastrointestinal: Positive for abdominal pain. Negative for constipation, diarrhea, nausea and vomiting.  Genitourinary: Negative for decreased urine volume, difficulty urinating, dysuria, flank pain, frequency and hematuria.  Musculoskeletal: Negative for arthralgias and back pain.  Skin: Negative for color change, pallor and rash.  Allergic/Immunologic: Negative for  immunocompromised state.  Neurological: Negative for dizziness, tremors, seizures, syncope, facial asymmetry, weakness, light-headedness, numbness and headaches.  All other systems reviewed and are negative.    Physical Exam Updated Vital Signs BP 134/74 (BP Location: Right Arm)   Pulse 79   Temp 98.4 F (36.9 C) (Oral)   Resp 18   LMP 12/02/2016   SpO2 100%   Physical Exam  Constitutional: She appears well-developed and  well-nourished. No distress.  HENT:  Head: Normocephalic and atraumatic.  Eyes: Pupils are equal, round, and reactive to light. Conjunctivae are normal.  Neck: Neck supple.  Cardiovascular: Normal rate and regular rhythm.   No murmur heard. Pulmonary/Chest: Effort normal and breath sounds normal. No respiratory distress. She exhibits tenderness.  Abdominal: Soft. There is tenderness in the epigastric area. There is no rigidity, no rebound, no guarding and no CVA tenderness.  Musculoskeletal: She exhibits no edema.  Neurological: She is alert.  Skin: Skin is warm and dry. Capillary refill takes less than 2 seconds.  Psychiatric: She has a normal mood and affect.  Nursing note and vitals reviewed.  ED Treatments / Results  Labs (all labs ordered are listed, but only abnormal results are displayed) Labs Reviewed  COMPREHENSIVE METABOLIC PANEL - Abnormal; Notable for the following:       Result Value   Potassium 3.4 (*)    Glucose, Bld 105 (*)    Calcium 8.6 (*)    All other components within normal limits  URINALYSIS, ROUTINE W REFLEX MICROSCOPIC - Abnormal; Notable for the following:    Color, Urine STRAW (*)    All other components within normal limits  LIPASE, BLOOD  CBC  I-STAT BETA HCG BLOOD, ED ([facility], [facility], [facility] ONLY)  I-STAT TROPONIN, ED    EKG  EKG Interpretation  Date/Time:  Saturday January 02 2017 18:52:20 EDT Ventricular Rate:  74 PR Interval:    QRS Duration: 100 QT Interval:  403 QTC Calculation: 448 R Axis:   82 Text Interpretation:  Sinus rhythm No previous tracing Confirmed by Cathren Laine ([id number]) on 01/02/2017 6:58:45 PM       Radiology No results found.  Procedures Procedures (including critical care time)  Medications Ordered in ED Medications - No data to display   Initial Impression / Assessment and Plan / ED Course  I have reviewed the triage vital signs and the nursing notes.  Pertinent labs & imaging results that were available during my  care of the patient were reviewed by me and considered in my medical decision making (see chart for details).     This is a 37 year old Hispanic female with PMH of recently diagnosed gastritis likely secondary to H pylori.  Patient's chest pain worsened with eating, tender to palpation.  Suspected to be GI related pathology as patient has active diagnosis of H. Pylori. Patient well-appearing, benign abdominal exam.  Lipase negative, CMP grossly unremarkable, CBC shows no leukocytosis or anemia urine studies unremarkable. Low likelihood of cystitis, pyelonephritis, nephrolithiasis, ovarian pathology.  Beta hCG negative.  Because of the age and risk factors of the patient, ACS will be ruled out with troponin x 1. Chest pain has been ongoing for few weeks. EKG performed. EKG: normal EKG, normal sinus rhythm. EKG reviewed by myself and the attending.   HEART score calculation: 1   No leukocytosis, no cough, no fever.  Unlikely PE as atypical presentation, PERC negative.  Amoxicillin prescription filled.  Importance was relayed to patient on taking all 3 medications for eradication of  H. pylori infection.  Follow-up with PCP as indicated.  Labs and imaging reviewed by myself and considered in medical decision making.  Imaging interpreted by radiology.   Return precautions given. All questions answered.  Final Clinical Impressions(s) / ED Diagnoses   Final diagnoses:  Gastritis, Helicobacter pylori    New Prescriptions New Prescriptions   AMOXICILLIN (AMOXIL) 500 MG CAPSULE    Take 1 capsule (500 mg total) by mouth 3 (three) times daily.     Shaune Pollack, MD 01/02/17 2019    Cathren Laine, MD 01/02/17 [phone]

## 2017-04-05 ENCOUNTER — Encounter: Payer: Self-pay | Admitting: Internal Medicine

## 2017-05-25 ENCOUNTER — Ambulatory Visit: Payer: Self-pay | Admitting: Internal Medicine

## 2017-05-25 ENCOUNTER — Encounter (INDEPENDENT_AMBULATORY_CARE_PROVIDER_SITE_OTHER): Payer: Self-pay

## 2017-05-25 ENCOUNTER — Encounter: Payer: Self-pay | Admitting: Internal Medicine

## 2017-05-25 VITALS — BP 126/82 | HR 80

## 2017-05-25 DIAGNOSIS — R1013 Epigastric pain: Secondary | ICD-10-CM

## 2017-05-25 DIAGNOSIS — K5900 Constipation, unspecified: Secondary | ICD-10-CM

## 2017-05-25 NOTE — Patient Instructions (Signed)
You have been scheduled for an abdominal ultrasound at [facility] (1st floor of hospital) on 05/31/2017 at 9;00am. Please arrive 15 minutes prior to your appointment for registration. Make certain not to have anything to eat or drink 6 hours prior to your appointment. Should you need to reschedule your appointment, please contact radiology at [phone]. This test typically takes about 30 minutes to perform.   You have been scheduled for an endoscopy. Please follow written instructions given to you at your visit today. If you use inhalers (even only as needed), please bring them with you on the day of your procedure. Your physician has requested that you go to www.startemmi.com and enter the access code given to you at your visit today. This web site gives a general overview about your procedure. However, you should still follow specific instructions given to you by our office regarding your preparation for the procedure.

## 2017-05-26 ENCOUNTER — Encounter: Payer: Self-pay | Admitting: Internal Medicine

## 2017-05-26 NOTE — Progress Notes (Signed)
HISTORY OF PRESENT ILLNESS:  Tracy Petty is a 37 y.o. female , non-English speaking [place] [employer], who is referred today by her primary care provider Dr. Jayme CloudGonzalez for evaluation of abdominal pain and constipation. Outside records including laboratories and x-rays have been reviewed. A professional medical interpreter companies the patient today. Patient states that problems with epigastric pain began 2-3 months ago. This occurs almost every day in the early morning. She describes this as a dull ache or punch. This can occur 1-4 times per day and generally last minutes not more than 30 minutes. Eating seems to make symptoms better. Multiple laboratories were normal including liver function tests. She did have abnormal Helicobacter pylori antibody for which she was treated with what sounds like triple therapy. She tells me that she had a follow-up breath test which was normal or negative. Blood counts are also normal including hemoglobin 13.7. Patient cannot describe any additional exacerbating or relieving factors. The pain is localized and nonradiating. Continues on PPI. She does not feel it treatment for Helicobacter pylori or PPI therapy has been helpful. Next, she complains of generalized lower abdominal discomfort. This seems to be related to constipation. She denies nocturnal symptoms. She occasionally dens over this helps. The lower abdominal discomfort is more cramping. She has had no weight loss. No bleeding. She is single with one daughter. She does not smoke or use alcohol  REVIEW OF SYSTEMS:  All non-GI ROS negative unless otherwise stated in the history of present illness except for back pain, sinus and allergy, headaches, menstrual cramps  Past Medical History:  Diagnosis Date  . High cholesterol     History reviewed. No pertinent surgical history.  Social History Tracy Petty  reports that  has never smoked. she has never used smokeless  tobacco. She reports that she drinks alcohol. She reports that she does not use drugs.  family history is not on file.  No Known Allergies     PHYSICAL EXAMINATION:  Vital signs: BP 126/82   Pulse 80  weight, BMI Constitutional: generally well-appearing, no acute distress Psychiatric: alert and oriented x3, cooperative Eyes: extraocular movements intact, anicteric, conjunctiva pink Mouth: oral pharynx moist, no lesions Neck: supple no lymphadenopathy Cardiovascular: heart regular rate and rhythm, no murmur Lungs: clear to auscultation bilaterally Abdomen: soft, obese, nontender, nondistended, no obvious ascites, no peritoneal signs, normal bowel sounds, no organomegaly Rectal: Omitted Extremities: no other, cyanosis, or lower extremity edema bilaterally Skin: no lesions on visible extremities Neuro: No focal deficits. Cranial nerves intact.  ASSESSMENT:  #1. 2-3 month history of epigastric pain as described. Treat for Helicobacter pylori. Rule out ulcer disease. Rule out related to constipation. Rule out biliary #2. Lower abdominal discomfort and constipation   PLAN:   #1. Continue PPI #2. Schedule upper endoscopy. The nature of the procedure, as well as the risks, benefits, and alternatives were carefully and thoroughly reviewed with the patient. Ample time for discussion and questions allowed. The patient understood, was satisfied, and agreed to proceed. #3. Abdominal ultrasound #4. MiraLAX for constipation #5. Further recommendations and follow-up to be determined  A copy of this consultation note has been sent to Dr. Jayme CloudGonzalez

## 2017-05-31 ENCOUNTER — Ambulatory Visit (HOSPITAL_COMMUNITY): Payer: Self-pay

## 2017-06-03 ENCOUNTER — Ambulatory Visit (HOSPITAL_COMMUNITY)
Admission: RE | Admit: 2017-06-03 | Discharge: 2017-06-03 | Disposition: A | Discharge status: Home / Self Care | Payer: Self-pay | Source: Ambulatory Visit | Attending: Internal Medicine | Admitting: Internal Medicine

## 2017-06-03 DIAGNOSIS — R1013 Epigastric pain: Secondary | ICD-10-CM | POA: Insufficient documentation

## 2017-06-03 DIAGNOSIS — K8020 Calculus of gallbladder without cholecystitis without obstruction: Secondary | ICD-10-CM | POA: Insufficient documentation

## 2017-06-03 DIAGNOSIS — K5900 Constipation, unspecified: Secondary | ICD-10-CM | POA: Insufficient documentation

## 2017-06-25 ENCOUNTER — Ambulatory Visit (AMBULATORY_SURGERY_CENTER): Payer: Self-pay | Admitting: Internal Medicine

## 2017-06-25 ENCOUNTER — Other Ambulatory Visit: Payer: Self-pay

## 2017-06-25 ENCOUNTER — Encounter: Payer: Self-pay | Admitting: Internal Medicine

## 2017-06-25 VITALS — BP 112/74 | HR 74 | Temp 98.9°F | Resp 11 | Ht 60.0 in | Wt 171.8 lb

## 2017-06-25 DIAGNOSIS — R1013 Epigastric pain: Secondary | ICD-10-CM

## 2017-06-25 MED ORDER — SODIUM CHLORIDE 0.9 % IV SOLN: 500.0000 mL | Freq: Once | INTRAVENOUS | Status: DC

## 2017-06-25 NOTE — Op Note (Signed)
[facility] Patient Name: Tracy BerryFlor Godinez Petty Procedure Date: 06/25/2017 10:02 AM MRN: [id number] Endoscopist: Wilhemina BonitoJohn N. Marina GoodellPerry , MD Age: [age] Referring MD:  Date of Birth: 02/19/1981 Gender: Female Account #: [id number] Procedure:                Upper GI endoscopy Indications:              Epigastric abdominal pain. NOTE: The patient                            continues with recurrent epigastric pain despite                            therapy. Recent abdominal ultrasound demonstrated                            gallstones. Medicines:                Monitored Anesthesia Care Procedure:                Pre-Anesthesia Assessment:                           - Prior to the procedure, a History and Physical                            was performed, and patient medications and                            allergies were reviewed. The patient's tolerance of                            previous anesthesia was also reviewed. The risks                            and benefits of the procedure and the sedation                            options and risks were discussed with the patient.                            All questions were answered, and informed consent                            was obtained. Prior Anticoagulants: The patient has                            taken no previous anticoagulant or antiplatelet                            agents. ASA Grade Assessment: II - A patient with                            mild systemic disease. After reviewing the risks  and benefits, the patient was deemed in                            satisfactory condition to undergo the procedure.                           After obtaining informed consent, the endoscope was                            passed under direct vision. Throughout the                            procedure, the patient's blood pressure, pulse, and                            oxygen saturations were monitored  continuously. The                            Endoscope was introduced through the mouth, and                            advanced to the second part of duodenum. The upper                            GI endoscopy was accomplished without difficulty.                            The patient tolerated the procedure well. Scope In: Scope Out: Findings:                 The esophagus was normal.                           The stomach was normal.                           The examined duodenum was normal.                           The cardia and gastric fundus were normal on                            retroflexion. Complications:            No immediate complications. Estimated Blood Loss:     Estimated blood loss: none. Impression:               1. Normal EGD                           2. Symptomatic cholelithiasis. Recommendation:           1. Resume previous diet                           2. Current medications  3. General surgical referral "symptomatic                            cholelithiasis, for laparoscopic cholecystectomy "                           4. Return your primary care provider for general                            medical care. GI follow-up as needed. Wilhemina Bonito. Marina Goodell, MD 06/25/2017 10:22:23 AM This report has been signed electronically.

## 2017-06-25 NOTE — Progress Notes (Signed)
No egg or soy allergy known to patient  No past sedation No diet pills per patient No home 02 use per patient  No blood thinners per patient  Pt denies issues with constipation  No A fib or A flutter

## 2017-06-25 NOTE — Patient Instructions (Signed)
YOU HAD AN ENDOSCOPIC PROCEDURE TODAY AT THE [facility]:   Refer to the procedure report that was given to you for any specific questions about what was found during the examination.  If the procedure report does not answer your questions, please call your gastroenterologist to clarify.  If you requested that your care partner not be given the details of your procedure findings, then the procedure report has been included in a sealed envelope for you to review at your convenience later.  YOU SHOULD EXPECT: Some feelings of bloating in the abdomen. Passage of more gas than usual.  Walking can help get rid of the air that was put into your GI tract during the procedure and reduce the bloating. If you had a lower endoscopy (such as a colonoscopy or flexible sigmoidoscopy) you may notice spotting of blood in your stool or on the toilet paper. If you underwent a bowel prep for your procedure, you may not have a normal bowel movement for a few days.  Please Note:  You might notice some irritation and congestion in your nose or some drainage.  This is from the oxygen used during your procedure.  There is no need for concern and it should clear up in a day or so.  SYMPTOMS TO REPORT IMMEDIATELY:   Following upper endoscopy (EGD)  Vomiting of blood or coffee ground material  New chest pain or pain under the shoulder blades  Painful or persistently difficult swallowing  New shortness of breath  Fever of 100F or higher  Black, tarry-looking stools  For urgent or emergent issues, a gastroenterologist can be reached at any hour by calling [phone].   DIET:  We do recommend a small meal at first, but then you may proceed to your regular diet.  Drink plenty of fluids but you should avoid alcoholic beverages for 24 hours.  ACTIVITY:  You should plan to take it easy for the rest of today and you should NOT DRIVE or use heavy machinery until tomorrow (because of the sedation medicines used  during the test).    FOLLOW UP: Our staff will call the number listed on your records the next business day following your procedure to check on you and address any questions or concerns that you may have regarding the information given to you following your procedure. If we do not reach you, we will leave a message.  However, if you are feeling well and you are not experiencing any problems, there is no need to return our call.  We will assume that you have returned to your regular daily activities without incident.  If any biopsies were taken you will be contacted by phone or by letter within the next 1-3 weeks.  Please call us at [phone] if you have not heard about the biopsies in 3 weeks.    SIGNATURES/CONFIDENTIALITY: You and/or your care partner have signed paperwork which will be entered into your electronic medical record.  These signatures attest to the fact that that the information above on your After Visit Summary has been reviewed and is understood.  Full responsibility of the confidentiality of this discharge information lies with you and/or your care-partner. 

## 2017-06-25 NOTE — Progress Notes (Signed)
To recovery, report to RN, VSS. 

## 2017-06-28 ENCOUNTER — Telehealth: Payer: Self-pay

## 2017-06-28 ENCOUNTER — Telehealth: Payer: Self-pay | Admitting: *Deleted

## 2017-06-28 NOTE — Telephone Encounter (Signed)
Records faxed to [facility] . Pt needs OV to discuss possible gallbladder surgery. Pt has no insurance and will be considered self pay.

## 2017-06-28 NOTE — Telephone Encounter (Signed)
  Follow up Call-  Call back number 06/25/2017  Post procedure Call Back phone  # [phone]  Permission to leave phone message Yes  Some recent data might be hidden     Patient questions:  Do you have a fever, pain , or abdominal swelling? No. Pain Score  0 *  Have you tolerated food without any problems? Yes.    Have you been able to return to your normal activities? Yes.    Do you have any questions about your discharge instructions: Diet   No. Medications  No. Follow up visit  No.  Do you have questions or concerns about your Care? No.  Actions: * If pain score is 4 or above: No action needed, pain <4.  Patient states "I'm okay"

## 2017-07-02 ENCOUNTER — Telehealth: Payer: Self-pay

## 2017-07-02 NOTE — Telephone Encounter (Signed)
Pt has appt with Dr. Andrey CampanileWilson at [facility] 07/12/17@1 :30pm.

## 2017-07-05 NOTE — Telephone Encounter (Signed)
Pt scheduled to see Dr. Andrey CampanileWilson with [facility] 07/12/17@1 :30pm.

## 2017-07-18 ENCOUNTER — Other Ambulatory Visit: Payer: Self-pay

## 2017-07-18 ENCOUNTER — Emergency Department (HOSPITAL_COMMUNITY): Payer: Medicaid Other | Admitting: Anesthesiology

## 2017-07-18 ENCOUNTER — Encounter (HOSPITAL_COMMUNITY)
Admission: EM | Disposition: A | Discharge status: Home / Self Care | Payer: Self-pay | Source: Home / Self Care | Attending: Emergency Medicine

## 2017-07-18 ENCOUNTER — Emergency Department (HOSPITAL_COMMUNITY): Payer: Medicaid Other

## 2017-07-18 ENCOUNTER — Observation Stay (HOSPITAL_COMMUNITY)
Admission: EM | Admit: 2017-07-18 | Discharge: 2017-07-19 | Disposition: A | Discharge status: Home / Self Care | Payer: Medicaid Other | Attending: General Surgery | Admitting: General Surgery

## 2017-07-18 ENCOUNTER — Encounter (HOSPITAL_COMMUNITY): Payer: Self-pay | Admitting: Emergency Medicine

## 2017-07-18 DIAGNOSIS — E7800 Pure hypercholesterolemia, unspecified: Secondary | ICD-10-CM | POA: Diagnosis not present

## 2017-07-18 DIAGNOSIS — Z87891 Personal history of nicotine dependence: Secondary | ICD-10-CM | POA: Diagnosis not present

## 2017-07-18 DIAGNOSIS — Z79899 Other long term (current) drug therapy: Secondary | ICD-10-CM | POA: Diagnosis not present

## 2017-07-18 DIAGNOSIS — K8012 Calculus of gallbladder with acute and chronic cholecystitis without obstruction: Secondary | ICD-10-CM | POA: Diagnosis not present

## 2017-07-18 DIAGNOSIS — R1011 Right upper quadrant pain: Secondary | ICD-10-CM | POA: Diagnosis present

## 2017-07-18 DIAGNOSIS — K810 Acute cholecystitis: Secondary | ICD-10-CM | POA: Diagnosis present

## 2017-07-18 HISTORY — PX: CHOLECYSTECTOMY: SHX55

## 2017-07-18 LAB — COMPREHENSIVE METABOLIC PANEL
ALK PHOS: 61 U/L (ref 38–126)
ALT: 17 U/L (ref 14–54)
AST: 20 U/L (ref 15–41)
Albumin: 4 g/dL (ref 3.5–5.0)
Anion gap: 11 (ref 5–15)
BILIRUBIN TOTAL: 0.9 mg/dL (ref 0.3–1.2)
BUN: 10 mg/dL (ref 6–20)
CALCIUM: 9.1 mg/dL (ref 8.9–10.3)
CO2: 22 mmol/L (ref 22–32)
CREATININE: 0.64 mg/dL (ref 0.44–1.00)
Chloride: 103 mmol/L (ref 101–111)
GFR calc Af Amer: >60 mL/min (ref >60)
GLUCOSE: 123 mg/dL — AB (ref 65–99)
POTASSIUM: 3.7 mmol/L (ref 3.5–5.1)
Sodium: 136 mmol/L (ref 135–145)
Total Protein: 7.5 g/dL (ref 6.5–8.1)

## 2017-07-18 LAB — CBC
HCT: 39.4 % (ref 36.0–46.0)
Hemoglobin: 14.3 g/dL (ref 12.0–15.0)
MCH: 30.6 pg (ref 26.0–34.0)
MCHC: 36.3 g/dL — AB (ref 30.0–36.0)
MCV: 84.2 fL (ref 78.0–100.0)
PLATELETS: 306 10*3/uL (ref 150–400)
RBC: 4.68 MIL/uL (ref 3.87–5.11)
RDW: 13.3 % (ref 11.5–15.5)
WBC: 13.4 10*3/uL — AB (ref 4.0–10.5)

## 2017-07-18 LAB — URINALYSIS, ROUTINE W REFLEX MICROSCOPIC
BILIRUBIN URINE: NEGATIVE
Bacteria, UA: NONE SEEN
GLUCOSE, UA: NEGATIVE mg/dL
KETONES UR: NEGATIVE mg/dL
LEUKOCYTES UA: NEGATIVE
NITRITE: NEGATIVE
PH: 5 (ref 5.0–8.0)
Protein, ur: NEGATIVE mg/dL
SPECIFIC GRAVITY, URINE: 1.014 (ref 1.005–1.030)
WBC, UA: NONE SEEN WBC/hpf (ref 0–5)

## 2017-07-18 LAB — LIPASE, BLOOD: Lipase: 30 U/L (ref 11–51)

## 2017-07-18 LAB — I-STAT BETA HCG BLOOD, ED (MC, WL, AP ONLY): I-stat hCG, quantitative: <5 m[IU]/mL (ref <5)

## 2017-07-18 SURGERY — LAPAROSCOPIC CHOLECYSTECTOMY WITH INTRAOPERATIVE CHOLANGIOGRAM
Anesthesia: General | Site: Abdomen

## 2017-07-18 MED ORDER — 0.9 % SODIUM CHLORIDE (POUR BTL) OPTIME
TOPICAL | Status: DC | PRN
  Administered 2017-07-18: 1000 mL

## 2017-07-18 MED ORDER — FENTANYL CITRATE (PF) 250 MCG/5ML IJ SOLN
INTRAMUSCULAR | Status: DC | PRN
  Administered 2017-07-18 (×3): 50 ug via INTRAVENOUS
  Administered 2017-07-18: 100 ug via INTRAVENOUS

## 2017-07-18 MED ORDER — PROPOFOL 10 MG/ML IV BOLUS
INTRAVENOUS | Status: DC | PRN
  Administered 2017-07-18: 110 mg via INTRAVENOUS
  Administered 2017-07-18: 40 mg via INTRAVENOUS
  Administered 2017-07-18: 10 mg via INTRAVENOUS

## 2017-07-18 MED ORDER — BUPIVACAINE HCL 0.25 % IJ SOLN
INTRAMUSCULAR | Status: DC | PRN
  Administered 2017-07-18: 28 mL

## 2017-07-18 MED ORDER — DEXAMETHASONE SODIUM PHOSPHATE 10 MG/ML IJ SOLN
INTRAMUSCULAR | Status: DC | PRN
  Administered 2017-07-18: 10 mg via INTRAVENOUS

## 2017-07-18 MED ORDER — MORPHINE SULFATE (PF) 4 MG/ML IV SOLN: 2.0000 mg | INTRAVENOUS | Status: DC | PRN

## 2017-07-18 MED ORDER — SODIUM CHLORIDE 0.9 % IV SOLN
INTRAVENOUS | Status: DC
  Administered 2017-07-18 – 2017-07-19 (×2): via INTRAVENOUS

## 2017-07-18 MED ORDER — OXYCODONE HCL 5 MG PO TABS
5.0000 mg | ORAL_TABLET | Freq: Once | ORAL | Status: AC | PRN
  Administered 2017-07-18: 5 mg via ORAL

## 2017-07-18 MED ORDER — HYDROCODONE-ACETAMINOPHEN 5-325 MG PO TABS
1.0000 | ORAL_TABLET | ORAL | Status: DC | PRN
  Administered 2017-07-18 – 2017-07-19 (×2): 2 via ORAL
  Filled 2017-07-18 (×2): qty 2

## 2017-07-18 MED ORDER — TRAMADOL HCL 50 MG PO TABS: 50.0000 mg | ORAL_TABLET | Freq: Four times a day (QID) | ORAL | Status: DC | PRN

## 2017-07-18 MED ORDER — MENTHOL 3 MG MT LOZG
1.0000 | LOZENGE | OROMUCOSAL | Status: DC | PRN
  Administered 2017-07-18: 3 mg via ORAL
  Filled 2017-07-18: qty 9

## 2017-07-18 MED ORDER — FENTANYL CITRATE (PF) 100 MCG/2ML IJ SOLN
25.0000 ug | INTRAMUSCULAR | Status: DC | PRN
  Administered 2017-07-18 (×3): 50 ug via INTRAVENOUS

## 2017-07-18 MED ORDER — POLYETHYLENE GLYCOL 3350 17 G PO PACK: 17.0000 g | PACK | Freq: Every day | ORAL | Status: DC | PRN

## 2017-07-18 MED ORDER — SODIUM CHLORIDE 0.9 % IV BOLUS (SEPSIS)
1000.0000 mL | Freq: Once | INTRAVENOUS | Status: AC
  Administered 2017-07-18: 1000 mL via INTRAVENOUS

## 2017-07-18 MED ORDER — MIDAZOLAM HCL 2 MG/2ML IJ SOLN
INTRAMUSCULAR | Status: AC
  Filled 2017-07-18: qty 2

## 2017-07-18 MED ORDER — FENTANYL CITRATE (PF) 100 MCG/2ML IJ SOLN
INTRAMUSCULAR | Status: AC
  Administered 2017-07-18: 50 ug via INTRAVENOUS
  Filled 2017-07-18: qty 2

## 2017-07-18 MED ORDER — HYDRALAZINE HCL 20 MG/ML IJ SOLN: 10.0000 mg | INTRAMUSCULAR | Status: DC | PRN

## 2017-07-18 MED ORDER — OXYCODONE HCL 5 MG/5ML PO SOLN: 5.0000 mg | Freq: Once | ORAL | Status: AC | PRN

## 2017-07-18 MED ORDER — MORPHINE SULFATE (PF) 2 MG/ML IV SOLN: 2.0000 mg | INTRAVENOUS | Status: DC | PRN

## 2017-07-18 MED ORDER — PROPOFOL 10 MG/ML IV BOLUS
INTRAVENOUS | Status: AC
  Filled 2017-07-18: qty 20

## 2017-07-18 MED ORDER — IOPAMIDOL (ISOVUE-300) INJECTION 61%
INTRAVENOUS | Status: AC
  Filled 2017-07-18: qty 50

## 2017-07-18 MED ORDER — KETOROLAC TROMETHAMINE 30 MG/ML IJ SOLN
INTRAMUSCULAR | Status: AC
Start: 2017-07-18 — End: 2017-07-18
  Administered 2017-07-18: 30 mg via INTRAVENOUS
  Filled 2017-07-18: qty 1

## 2017-07-18 MED ORDER — IOPAMIDOL (ISOVUE-300) INJECTION 61%
INTRAVENOUS | Status: AC
  Administered 2017-07-18: 100 mL
  Filled 2017-07-18: qty 100

## 2017-07-18 MED ORDER — MIDAZOLAM HCL 2 MG/2ML IJ SOLN
INTRAMUSCULAR | Status: DC | PRN
  Administered 2017-07-18: 2 mg via INTRAVENOUS

## 2017-07-18 MED ORDER — ONDANSETRON HCL 4 MG/2ML IJ SOLN: 4.0000 mg | Freq: Four times a day (QID) | INTRAMUSCULAR | Status: DC | PRN

## 2017-07-18 MED ORDER — OXYCODONE HCL 5 MG PO TABS
ORAL_TABLET | ORAL | Status: AC
  Administered 2017-07-18: 5 mg via ORAL
  Filled 2017-07-18: qty 1

## 2017-07-18 MED ORDER — ONDANSETRON HCL 4 MG/2ML IJ SOLN
4.0000 mg | Freq: Once | INTRAMUSCULAR | Status: AC
  Administered 2017-07-18: 4 mg via INTRAVENOUS
  Filled 2017-07-18: qty 2

## 2017-07-18 MED ORDER — BUPIVACAINE-EPINEPHRINE (PF) 0.25% -1:200000 IJ SOLN
INTRAMUSCULAR | Status: AC
  Filled 2017-07-18: qty 30

## 2017-07-18 MED ORDER — MORPHINE SULFATE (PF) 4 MG/ML IV SOLN
4.0000 mg | Freq: Once | INTRAVENOUS | Status: AC
  Administered 2017-07-18: 4 mg via INTRAVENOUS
  Filled 2017-07-18: qty 1

## 2017-07-18 MED ORDER — FENTANYL CITRATE (PF) 100 MCG/2ML IJ SOLN
INTRAMUSCULAR | Status: AC
  Filled 2017-07-18: qty 2

## 2017-07-18 MED ORDER — ACETAMINOPHEN 160 MG/5ML PO SOLN: 325.0000 mg | ORAL | Status: DC | PRN

## 2017-07-18 MED ORDER — FENTANYL CITRATE (PF) 250 MCG/5ML IJ SOLN
INTRAMUSCULAR | Status: AC
  Filled 2017-07-18: qty 5

## 2017-07-18 MED ORDER — ROCURONIUM BROMIDE 10 MG/ML (PF) SYRINGE
PREFILLED_SYRINGE | INTRAVENOUS | Status: DC | PRN
  Administered 2017-07-18: 50 mg via INTRAVENOUS

## 2017-07-18 MED ORDER — GLYCOPYRROLATE 0.2 MG/ML IJ SOLN
INTRAMUSCULAR | Status: DC | PRN
  Administered 2017-07-18: 0.4 mg via INTRAVENOUS

## 2017-07-18 MED ORDER — ACETAMINOPHEN 325 MG PO TABS: 325.0000 mg | ORAL_TABLET | ORAL | Status: DC | PRN

## 2017-07-18 MED ORDER — ONDANSETRON 4 MG PO TBDP: 4.0000 mg | ORAL_TABLET | Freq: Four times a day (QID) | ORAL | Status: DC | PRN

## 2017-07-18 MED ORDER — PHENOL 1.4 % MT LIQD
1.0000 | OROMUCOSAL | Status: DC | PRN
  Administered 2017-07-18: 1 via OROMUCOSAL
  Filled 2017-07-18: qty 177

## 2017-07-18 MED ORDER — SODIUM CHLORIDE 0.9 % IR SOLN
Status: DC | PRN
  Administered 2017-07-18: 1000 mL

## 2017-07-18 MED ORDER — KETOROLAC TROMETHAMINE 30 MG/ML IJ SOLN
30.0000 mg | Freq: Four times a day (QID) | INTRAMUSCULAR | Status: DC
  Administered 2017-07-18 – 2017-07-19 (×4): 30 mg via INTRAVENOUS
  Filled 2017-07-18 (×3): qty 1

## 2017-07-18 MED ORDER — LACTATED RINGERS IV SOLN
INTRAVENOUS | Status: DC | PRN
  Administered 2017-07-18: 13:00:00 via INTRAVENOUS

## 2017-07-18 MED ORDER — NEOSTIGMINE METHYLSULFATE 10 MG/10ML IV SOLN
INTRAVENOUS | Status: DC | PRN
  Administered 2017-07-18: 3 mg via INTRAVENOUS

## 2017-07-18 MED ORDER — SODIUM CHLORIDE 0.9 % IV SOLN: 2.0000 g | Freq: Once | INTRAVENOUS | Status: DC

## 2017-07-18 MED ORDER — ONDANSETRON HCL 4 MG/2ML IJ SOLN
INTRAMUSCULAR | Status: DC | PRN
  Administered 2017-07-18: 4 mg via INTRAVENOUS

## 2017-07-18 MED ORDER — KETOROLAC TROMETHAMINE 30 MG/ML IJ SOLN: 30.0000 mg | Freq: Four times a day (QID) | INTRAMUSCULAR | Status: DC | PRN

## 2017-07-18 SURGICAL SUPPLY — 47 items
ADH SKN CLS APL DERMABOND .7 (GAUZE/BANDAGES/DRESSINGS) ×1
ADH SKN CLS LQ APL DERMABOND (GAUZE/BANDAGES/DRESSINGS) ×1
APPLIER CLIP ROT 10 11.4 M/L (STAPLE)
APR CLP MED LRG 11.4X10 (STAPLE)
BAG SPEC RTRVL 10 TROC 200 (ENDOMECHANICALS) ×1
BLADE CLIPPER SURG (BLADE) IMPLANT
CANISTER SUCT 3000ML PPV (MISCELLANEOUS) ×3 IMPLANT
CATH CHOLANG 76X19 KUMAR (CATHETERS) ×3 IMPLANT
CHLORAPREP W/TINT 26ML (MISCELLANEOUS) ×3 IMPLANT
CLIP APPLIE ROT 10 11.4 M/L (STAPLE) IMPLANT
CLIP LIGATING HEMO O LOK GREEN (MISCELLANEOUS) ×3 IMPLANT
CLIP VESOLOCK XL 6/CT (CLIP) IMPLANT
COVER MAYO STAND STRL (DRAPES) ×3 IMPLANT
COVER SURGICAL LIGHT HANDLE (MISCELLANEOUS) ×3 IMPLANT
DERMABOND ADHESIVE PROPEN (GAUZE/BANDAGES/DRESSINGS) ×2
DERMABOND ADVANCED (GAUZE/BANDAGES/DRESSINGS) ×2
DERMABOND ADVANCED .7 DNX12 (GAUZE/BANDAGES/DRESSINGS) ×1 IMPLANT
DERMABOND ADVANCED .7 DNX6 (GAUZE/BANDAGES/DRESSINGS) IMPLANT
DRAPE C-ARM 42X72 X-RAY (DRAPES) ×3 IMPLANT
ELECT REM PT RETURN 9FT ADLT (ELECTROSURGICAL) ×3
ELECTRODE REM PT RTRN 9FT ADLT (ELECTROSURGICAL) ×1 IMPLANT
GLOVE BIOGEL PI IND STRL 7.0 (GLOVE) ×1 IMPLANT
GLOVE BIOGEL PI INDICATOR 7.0 (GLOVE) ×2
GLOVE SURG SS PI 7.0 STRL IVOR (GLOVE) ×3 IMPLANT
GOWN STRL REUS W/ TWL LRG LVL3 (GOWN DISPOSABLE) ×3 IMPLANT
GOWN STRL REUS W/TWL LRG LVL3 (GOWN DISPOSABLE) ×9
GRASPER SUT TROCAR 14GX15 (MISCELLANEOUS) ×3 IMPLANT
KIT BASIN OR (CUSTOM PROCEDURE TRAY) ×3 IMPLANT
KIT ROOM TURNOVER OR (KITS) ×3 IMPLANT
NEEDLE 22X1 1/2 (OR ONLY) (NEEDLE) ×3 IMPLANT
NS IRRIG 1000ML POUR BTL (IV SOLUTION) ×3 IMPLANT
PAD ARMBOARD 7.5X6 YLW CONV (MISCELLANEOUS) ×3 IMPLANT
POUCH RETRIEVAL ECOSAC 10 (ENDOMECHANICALS) ×1 IMPLANT
POUCH RETRIEVAL ECOSAC 10MM (ENDOMECHANICALS) ×2
SCISSORS LAP 5X35 DISP (ENDOMECHANICALS) ×3 IMPLANT
SET IRRIG TUBING LAPAROSCOPIC (IRRIGATION / IRRIGATOR) ×3 IMPLANT
SLEEVE ENDOPATH XCEL 5M (ENDOMECHANICALS) ×6 IMPLANT
SPECIMEN JAR SMALL (MISCELLANEOUS) ×3 IMPLANT
STOPCOCK 4 WAY LG BORE MALE ST (IV SETS) ×3 IMPLANT
SUT MNCRL AB 4-0 PS2 18 (SUTURE) ×3 IMPLANT
TOWEL OR 17X24 6PK STRL BLUE (TOWEL DISPOSABLE) ×3 IMPLANT
TOWEL OR 17X26 10 PK STRL BLUE (TOWEL DISPOSABLE) ×3 IMPLANT
TRAY LAPAROSCOPIC MC (CUSTOM PROCEDURE TRAY) ×3 IMPLANT
TROCAR XCEL 12X100 BLDLESS (ENDOMECHANICALS) ×3 IMPLANT
TROCAR XCEL NON-BLD 5MMX100MML (ENDOMECHANICALS) ×3 IMPLANT
TUBING INSUFFLATION (TUBING) ×3 IMPLANT
WATER STERILE IRR 1000ML POUR (IV SOLUTION) ×3 IMPLANT

## 2017-07-18 NOTE — ED Triage Notes (Signed)
Reports RUQ abdominal pain that started this morning.  Describes as stabbing.  Reports one n/v episode.  Eating and drinking okay otherwise.

## 2017-07-18 NOTE — ED Provider Notes (Signed)
[facility]  SURGICAL Provider Note   CSN: [id number] Arrival date & time: 07/18/17  0036     History   Chief Complaint Chief Complaint  Patient presents with  . Abdominal Pain    HPI Cathalina Barcia is a 37 y.o. female.  HPI Patient presents with right-sided abdominal pain starting yesterday morning radiating through to the back.  Associated with nausea and vomiting.  Denies fever or chills.  No diarrhea.  History of constipation but no recent changes.  Said several months of similar pain and recently saw gastroneurologist and had ultrasound performed.  Evidence of cholelithiasis.  Referred to general surgery.  Denies urinary symptoms including dysuria, hematuria or frequency. Past Medical History:  Diagnosis Date  . Allergy    spring  . High cholesterol     Patient Active Problem List   Diagnosis Date Noted  . Acute cholecystitis 07/18/2017    Past Surgical History:  Procedure Laterality Date  . CHOLECYSTECTOMY N/A 07/18/2017   Procedure: LAPAROSCOPIC CHOLECYSTECTOMY;  Surgeon: Kinsinger, De Blanch, MD;  Location: [facility] OR;  Service: General;  Laterality: N/A;  . NSVD     x1    OB History    No data available       Home Medications    Prior to Admission medications   Medication Sig Start Date End Date Taking? Authorizing Provider  Omega-3 1000 MG CAPS Take 1,000 mg by mouth daily.   Yes [provider]  meloxicam (MOBIC) 7.5 MG tablet Take 1 tablet (7.5 mg total) by mouth daily. Patient not taking: Reported on 05/25/2017 10/05/15   Trena Platt D, PA  polyethylene glycol powder (MIRALAX) powder Place 1 capfull of powder in 8 oz of water or gatorade and consume daily. If you are unable to have a bowel movement, increase dosage to 2 capfulls daily. Do not use for more than 1 week without consulting your doctor. Patient not taking: Reported on 05/25/2017 08/01/16   Charlie Pitter, MD    Family History Family  History  Problem Relation Age of Onset  . Heart disease Neg Hx   . Colon cancer Neg Hx   . Diabetes Neg Hx   . Colon polyps Neg Hx   . Rectal cancer Neg Hx   . Stomach cancer Neg Hx     Social History Social History   Tobacco Use  . Smoking status: Former Games developer  . Smokeless tobacco: Never Used  Substance Use Topics  . Alcohol use: Yes    Alcohol/week: 0.0 oz    Comment: occasionally   . Drug use: No     Allergies   Patient has no known allergies.   Review of Systems Review of Systems  Constitutional: Negative for chills and fever.  HENT: Negative for sore throat and trouble swallowing.   Respiratory: Negative for shortness of breath.   Cardiovascular: Negative for chest pain.  Gastrointestinal: Positive for abdominal pain, nausea and vomiting. Negative for constipation and diarrhea.  Genitourinary: Negative for dysuria, flank pain, frequency, hematuria and pelvic pain.  Musculoskeletal: Positive for back pain. Negative for neck pain and neck stiffness.  Skin: Negative for rash and wound.  Neurological: Negative for dizziness, weakness, light-headedness, numbness and headaches.  All other systems reviewed and are negative.    Physical Exam Updated Vital Signs BP 117/70 (BP Location: Left Arm)   Pulse 69   Temp 98 F (36.7 C) (Oral)   Resp 18   Ht 5' (1.524  m)   Wt 74.1 kg (163 lb 5.8 oz)   LMP 06/25/2017   SpO2 99%   BMI 31.90 kg/m   Physical Exam  Constitutional: She is oriented to person, place, and time. She appears well-developed and well-nourished.  HENT:  Head: Normocephalic and atraumatic.  Mouth/Throat: Oropharynx is clear and moist.  Eyes: EOM are normal. Pupils are equal, round, and reactive to light.  Neck: Normal range of motion. Neck supple.  Cardiovascular: Normal rate and regular rhythm.  Pulmonary/Chest: Effort normal and breath sounds normal. No stridor. No respiratory distress. She has no wheezes. She has no rales. She exhibits no  tenderness.  Abdominal: Soft. Bowel sounds are normal. There is tenderness. There is no rebound and no guarding.  Right upper and lower quadrant tenderness to palpation.  No rebound or guarding.  Musculoskeletal: Normal range of motion. She exhibits no edema or tenderness.  No CVA tenderness bilaterally.  No lower extremity swelling, asymmetry or tenderness.  Distal pulses are 2+.  Neurological: She is alert and oriented to person, place, and time.  Moves all extremities without focal deficit.  Sensation fully intact.  Skin: Skin is warm and dry. Capillary refill takes less than 2 seconds. No rash noted. No erythema.  Psychiatric: She has a normal mood and affect. Her behavior is normal.  Nursing note and vitals reviewed.    ED Treatments / Results  Labs (all labs ordered are listed, but only abnormal results are displayed) Labs Reviewed  COMPREHENSIVE METABOLIC PANEL - Abnormal; Notable for the following components:      Result Value   Glucose, Bld 123 (*)    All other components within normal limits  CBC - Abnormal; Notable for the following components:   WBC 13.4 (*)    MCHC 36.3 (*)    All other components within normal limits  URINALYSIS, ROUTINE W REFLEX MICROSCOPIC - Abnormal; Notable for the following components:   Hgb urine dipstick SMALL (*)    Squamous Epithelial / LPF 0-5 (*)    All other components within normal limits  LIPASE, BLOOD  I-STAT BETA HCG BLOOD, ED ([facility], [facility], [facility] ONLY)  SURGICAL PATHOLOGY    EKG  EKG Interpretation None       Radiology Ct Abdomen Pelvis W Contrast  Result Date: 07/18/2017 CLINICAL DATA:  Right upper quadrant abdominal pain. EXAM: CT ABDOMEN AND PELVIS WITH CONTRAST TECHNIQUE: Multidetector CT imaging of the abdomen and pelvis was performed using the standard protocol following bolus administration of intravenous contrast. CONTRAST:  ISOVUE-300 IOPAMIDOL (ISOVUE-300) INJECTION 61% COMPARISON:  Right upper quadrant ultrasound  06/03/2017. FINDINGS: Lower chest: Normal heart size. Lung bases are clear. No pleural effusion. Hepatobiliary: Liver is normal in size and contour. No focal hepatic lesion is identified. There is wall thickening of the gallbladder with small amount of surrounding fat stranding. No intrahepatic or extrahepatic biliary ductal dilatation. Pancreas: Unremarkable Spleen: Unremarkable Adrenals/Urinary Tract: Adrenal glands are normal. Kidneys enhance symmetrically with contrast. No hydronephrosis. Urinary bladder is unremarkable. Stomach/Bowel: No abnormal bowel wall thickening or evidence for bowel obstruction. No free fluid or free intraperitoneal air. Normal morphology of the stomach. Normal appendix. Vascular/Lymphatic: Normal caliber abdominal aorta. No retroperitoneal lymphadenopathy. Reproductive: Uterus and adnexal structures are unremarkable. Other: None. Musculoskeletal: No aggressive or acute appearing osseous lesions. Lumbar spine degenerative changes. IMPRESSION: Gallbladder wall thickening and surrounding fat stranding/fluid raising the possibility of acute cholecystitis. Consider further evaluation with right upper quadrant ultrasound. Electronically Signed   By: Francis Gaines.D.  On: 07/18/2017 09:57    Procedures Procedures (including critical care time)  Medications Ordered in ED Medications  cefTRIAXone (ROCEPHIN) 2 g in sodium chloride 0.9 % 100 mL IVPB ( Intravenous MAR Unhold 07/18/17 1608)  0.9 %  sodium chloride infusion ( Intravenous New Bag/Given 07/19/17 0506)  HYDROcodone-acetaminophen (NORCO/VICODIN) 5-325 MG per tablet 1-2 tablet (2 tablets Oral Given 07/18/17 2215)  hydrALAZINE (APRESOLINE) injection 10 mg (not administered)  ketorolac (TORADOL) 30 MG/ML injection 30 mg (30 mg Intravenous Given 07/19/17 0506)    Followed by  ketorolac (TORADOL) 30 MG/ML injection 30 mg (not administered)  traMADol (ULTRAM) tablet 50 mg (not administered)  ondansetron (ZOFRAN-ODT) disintegrating  tablet 4 mg (not administered)    Or  ondansetron (ZOFRAN) injection 4 mg (not administered)  polyethylene glycol (MIRALAX / GLYCOLAX) packet 17 g (not administered)  fentaNYL (SUBLIMAZE) 100 MCG/2ML injection (not administered)  morphine 4 MG/ML injection 2 mg (not administered)  phenol (CHLORASEPTIC) mouth spray 1 spray (1 spray Mouth/Throat Given 07/18/17 2215)  menthol-cetylpyridinium (CEPACOL) lozenge 3 mg (3 mg Oral Given 07/18/17 2216)  sodium chloride 0.9 % bolus 1,000 mL (0 mLs Intravenous Stopped 07/18/17 1007)  morphine 4 MG/ML injection 4 mg (4 mg Intravenous Given 07/18/17 0840)  ondansetron (ZOFRAN) injection 4 mg (4 mg Intravenous Given 07/18/17 0840)  iopamidol (ISOVUE-300) 61 % injection (100 mLs  Contrast Given 07/18/17 0912)  oxyCODONE (Oxy IR/ROXICODONE) immediate release tablet 5 mg (5 mg Oral Given 07/18/17 1540)    Or  oxyCODONE (ROXICODONE) 5 MG/5ML solution 5 mg ( Oral See Alternative 07/18/17 1540)     Initial Impression / Assessment and Plan / ED Course  I have reviewed the triage vital signs and the nursing notes.  Pertinent labs & imaging results that were available during my care of the patient were reviewed by me and considered in my medical decision making (see chart for details).    CT with evidence of gallbladder inflammation.  Elevated white blood cell count.  Previous ultrasound with confirmed cholelithiasis.  Discussed with general surgery who will see patient in the emergency department.  Patient will remain NPO   Final Clinical Impressions(s) / ED Diagnoses   Final diagnoses:  RUQ abdominal pain    ED Discharge Orders    None       Loren RacerYelverton, Negar Sieler, MD 07/19/17 [phone]

## 2017-07-18 NOTE — Anesthesia Postprocedure Evaluation (Signed)
Anesthesia Post Note  Patient: Tracy Petty  Procedure(s) Performed: LAPAROSCOPIC CHOLECYSTECTOMY (N/A Abdomen)     Patient location during evaluation: PACU Anesthesia Type: General Level of consciousness: awake and alert Pain management: pain level controlled Vital Signs Assessment: post-procedure vital signs reviewed and stable Respiratory status: spontaneous breathing, nonlabored ventilation, respiratory function stable and patient connected to nasal cannula oxygen Cardiovascular status: blood pressure returned to baseline and stable Postop Assessment: no apparent nausea or vomiting Anesthetic complications: no    Last Vitals:  Vitals:   07/18/17 1530 07/18/17 1545  BP: (!) 147/81 (!) 153/88  Pulse: 96 70  Resp: 15 17  Temp:  36.5 C  SpO2: 97% 99%    Last Pain:  Vitals:   07/18/17 1007  TempSrc:   PainSc: 0-No pain                 Analaya Hoey

## 2017-07-18 NOTE — Transfer of Care (Signed)
Immediate Anesthesia Transfer of Care Note  Patient: Tracy Petty  Procedure(s) Performed: LAPAROSCOPIC CHOLECYSTECTOMY (N/A Abdomen)  Patient Location: PACU  Anesthesia Type:General  Level of Consciousness: awake, alert  and oriented  Airway & Oxygen Therapy: Patient Spontanous Breathing and Patient connected to nasal cannula oxygen  Post-op Assessment: Report given to RN and Post -op Vital signs reviewed and stable  Post vital signs: Reviewed and stable  Last Vitals:  Vitals:   07/18/17 1205 07/18/17 1444  BP: 107/76   Pulse: 69 76  Resp: 17 16  Temp:    SpO2: 100% 97%    Last Pain:  Vitals:   07/18/17 1007  TempSrc:   PainSc: 0-No pain         Complications: No apparent anesthesia complications

## 2017-07-18 NOTE — Progress Notes (Signed)
Pt admitted to 6N30 from PACU, s/p Lap chole, Dr. Sheliah HatchKinsinger.  Family at bedside.  Garment/textile technologistnterpreter assistant at bedside.

## 2017-07-18 NOTE — H&P (Addendum)
Tracy Petty is an 37 y.o. female.   Chief Complaint: abdominal pain HPI: 37 yo female with 1 month of abdominal pain. Earlier she was treated for reflux and underwent endoscopy showing no abnormalities. She continues to have the pain and for the last 2 days the pain has been constant. It is epigastric in location. She has had multiple episodes of vomiting this weekend. She denies diarrhea or constipation.  Past Medical History:  Diagnosis Date  . Allergy    spring  . High cholesterol     Past Surgical History:  Procedure Laterality Date  . NSVD     x1    Family History  Problem Relation Age of Onset  . Heart disease Neg Hx   . Colon cancer Neg Hx   . Diabetes Neg Hx   . Colon polyps Neg Hx   . Rectal cancer Neg Hx   . Stomach cancer Neg Hx    Social History:  reports that she has quit smoking. she has never used smokeless tobacco. She reports that she drinks alcohol. She reports that she does not use drugs.  Allergies: No Known Allergies   (Not in a hospital admission)  Results for orders placed or performed during the hospital encounter of 07/18/17 (from the past 48 hour(s))  Urinalysis, Routine w reflex microscopic     Status: Abnormal   Collection Time: 07/18/17  1:42 AM  Result Value Ref Range   Color, Urine YELLOW YELLOW   APPearance CLEAR CLEAR   Specific Gravity, Urine 1.014 1.005 - 1.030   pH 5.0 5.0 - 8.0   Glucose, UA NEGATIVE NEGATIVE mg/dL   Hgb urine dipstick SMALL (A) NEGATIVE   Bilirubin Urine NEGATIVE NEGATIVE   Ketones, ur NEGATIVE NEGATIVE mg/dL   Protein, ur NEGATIVE NEGATIVE mg/dL   Nitrite NEGATIVE NEGATIVE   Leukocytes, UA NEGATIVE NEGATIVE   RBC / HPF 0-5 0 - 5 RBC/hpf   WBC, UA NONE SEEN 0 - 5 WBC/hpf   Bacteria, UA NONE SEEN NONE SEEN   Squamous Epithelial / LPF 0-5 (A) NONE SEEN    Comment: Performed at [facility], [address], [place], [place] [address]  Lipase, blood     Status: None   Collection Time: 07/18/17   1:47 AM  Result Value Ref Range   Lipase 30 11 - 51 U/L    Comment: Performed at [facility] [address] [place], [place] [address]  Comprehensive metabolic panel     Status: Abnormal   Collection Time: 07/18/17  1:47 AM  Result Value Ref Range   Sodium 136 135 - 145 mmol/L   Potassium 3.7 3.5 - 5.1 mmol/L   Chloride 103 101 - 111 mmol/L   CO2 22 22 - 32 mmol/L   Glucose, Bld 123 (H) 65 - 99 mg/dL   BUN 10 6 - 20 mg/dL   Creatinine, Ser 0.64 0.44 - 1.00 mg/dL   Calcium 9.1 8.9 - 10.3 mg/dL   Total Protein 7.5 6.5 - 8.1 g/dL   Albumin 4.0 3.5 - 5.0 g/dL   AST 20 15 - 41 U/L   ALT 17 14 - 54 U/L   Alkaline Phosphatase 61 38 - 126 U/L   Total Bilirubin 0.9 0.3 - 1.2 mg/dL   GFR calc non Af Amer >60 >60 mL/min   GFR calc Af Amer >60 >60 mL/min    Comment: (NOTE) The eGFR has been calculated using the CKD EPI equation. This  calculation has not been validated in all clinical situations. eGFR's persistently <60 mL/min signify possible Chronic Kidney Disease.    Anion gap 11 5 - 15    Comment: Performed at [facility] [address] [place], [place] [address]  CBC     Status: Abnormal   Collection Time: 07/18/17  1:47 AM  Result Value Ref Range   WBC 13.4 (H) 4.0 - 10.5 K/uL   RBC 4.68 3.87 - 5.11 MIL/uL   Hemoglobin 14.3 12.0 - 15.0 g/dL   HCT 39.4 36.0 - 46.0 %   MCV 84.2 78.0 - 100.0 fL   MCH 30.6 26.0 - 34.0 pg   MCHC 36.3 (H) 30.0 - 36.0 g/dL   RDW 13.3 11.5 - 15.5 %   Platelets 306 150 - 400 K/uL    Comment: Performed at [facility], [place] [address], [place], [place] [address]  I-Stat beta hCG blood, ED     Status: None   Collection Time: 07/18/17  1:53 AM  Result Value Ref Range   I-stat hCG, quantitative <5.0 <5 mIU/mL   Comment 3            Comment:   GEST. AGE      CONC.  (mIU/mL)   <=1 WEEK        5 - 50     2 WEEKS       50 - 500     3 WEEKS       100 - 10,000     4 WEEKS     1,000 - 30,000        FEMALE AND NON-PREGNANT FEMALE:      LESS THAN 5 mIU/mL    Ct Abdomen Pelvis W Contrast  Result Date: 07/18/2017 CLINICAL DATA:  Right upper quadrant abdominal pain. EXAM: CT ABDOMEN AND PELVIS WITH CONTRAST TECHNIQUE: Multidetector CT imaging of the abdomen and pelvis was performed using the standard protocol following bolus administration of intravenous contrast. CONTRAST:  119m ISOVUE-300 IOPAMIDOL (ISOVUE-300) INJECTION 61% COMPARISON:  Right upper quadrant ultrasound 06/03/2017. FINDINGS: Lower chest: Normal heart size. Lung bases are clear. No pleural effusion. Hepatobiliary: Liver is normal in size and contour. No focal hepatic lesion is identified. There is wall thickening of the gallbladder with small amount of surrounding fat stranding. No intrahepatic or extrahepatic biliary ductal dilatation. Pancreas: Unremarkable Spleen: Unremarkable Adrenals/Urinary Tract: Adrenal glands are normal. Kidneys enhance symmetrically with contrast. No hydronephrosis. Urinary bladder is unremarkable. Stomach/Bowel: No abnormal bowel wall thickening or evidence for bowel obstruction. No free fluid or free intraperitoneal air. Normal morphology of the stomach. Normal appendix. Vascular/Lymphatic: Normal caliber abdominal aorta. No retroperitoneal lymphadenopathy. Reproductive: Uterus and adnexal structures are unremarkable. Other: None. Musculoskeletal: No aggressive or acute appearing osseous lesions. Lumbar spine degenerative changes. IMPRESSION: Gallbladder wall thickening and surrounding fat stranding/fluid raising the possibility of acute cholecystitis. Consider further evaluation with right upper quadrant ultrasound. Electronically Signed   By: DLovey NewcomerM.D.   On: 07/18/2017 09:57    Review of Systems  Constitutional: Negative for chills and fever.  HENT: Negative for hearing loss.   Eyes: Negative for blurred vision and double vision.  Respiratory: Negative for cough and hemoptysis.   Cardiovascular: Negative for chest pain and  palpitations.  Gastrointestinal: Positive for abdominal pain, nausea and vomiting. Negative for diarrhea.  Genitourinary: Negative for dysuria and urgency.  Musculoskeletal: Negative for myalgias and neck pain.  Skin: Negative for itching and rash.  Neurological: Negative for  dizziness, tingling and headaches.  Endo/Heme/Allergies: Does not bruise/bleed easily.  Psychiatric/Behavioral: Negative for depression and suicidal ideas.    Blood pressure 103/69, pulse 64, temperature 98.1 F (36.7 C), temperature source Oral, resp. rate 17, height 5' (1.524 m), weight 74.1 kg (163 lb 5.8 oz), last menstrual period 06/25/2017, SpO2 100 %. Physical Exam  Vitals reviewed. Constitutional: She is oriented to person, place, and time. She appears well-developed and well-nourished.  HENT:  Head: Normocephalic and atraumatic.  Eyes: Conjunctivae and EOM are normal. Pupils are equal, round, and reactive to light.  Neck: Normal range of motion. Neck supple.  Cardiovascular: Normal rate and regular rhythm.  Respiratory: Effort normal and breath sounds normal.  GI: Soft. Bowel sounds are normal. She exhibits no distension. There is tenderness in the right upper quadrant and epigastric area. There is negative Murphy's sign.  Musculoskeletal: Normal range of motion.  Neurological: She is alert and oriented to person, place, and time.  Skin: Skin is warm and dry.  Psychiatric: She has a normal mood and affect. Her behavior is normal.     Assessment/Plan 37 yo female with evidence of acute cholecystitis -lap chole -We discussed the etiology of her pain, we discussed treatment options and recommended surgery. We discussed details of surgery including general anesthesia, laparoscopic approach, identification of cystic duct and common bile duct. Ligation of cystic duct and cystic artery. Possible need for intraoperative cholangiogram or open procedure. Possible risks of common bile duct injury, liver injury,  cystic duct leak, bleeding, infection, post-cholecystectomy syndrome. The patient showed good understanding and all questions were answered -IV abx -IV fluids -admission to hospital postoperatively -pain control  Mickeal Skinner, MD 07/18/2017, 11:55 AM

## 2017-07-18 NOTE — Progress Notes (Signed)
Interpretor via Str[employer] iPad Lucianne Leiutilzed - Mary [phone] to update patient, explain about gas pain, *& re-evaluate post op pain & nausea

## 2017-07-18 NOTE — Anesthesia Preprocedure Evaluation (Signed)
Anesthesia Evaluation  Patient identified by MRN, date of birth, ID band Patient awake    Reviewed: Allergy & Precautions, NPO status , Patient's Chart, lab work & pertinent test results  History of Anesthesia Complications Negative for: history of anesthetic complications  Airway Mallampati: II  TM Distance: >3 FB Neck ROM: Full    Dental  (+) Teeth Intact   Pulmonary neg shortness of breath, neg sleep apnea, neg COPD, neg recent URI, former smoker,    breath sounds clear to auscultation       Cardiovascular negative cardio ROS   Rhythm:Regular     Neuro/Psych negative neurological ROS  negative psych ROS   GI/Hepatic Neg liver ROS, cholecystitis   Endo/Other  negative endocrine ROS  Renal/GU negative Renal ROS     Musculoskeletal   Abdominal   Peds  Hematology negative hematology ROS (+)   Anesthesia Other Findings   Reproductive/Obstetrics                             Anesthesia Physical Anesthesia Plan  ASA: II  Anesthesia Plan: General   Post-op Pain Management:    Induction: Intravenous  PONV Risk Score and Plan: 3 and Ondansetron and Dexamethasone  Airway Management Planned: Oral ETT  Additional Equipment: None  Intra-op Plan:   Post-operative Plan: Extubation in OR  Informed Consent: I have reviewed the patients History and Physical, chart, labs and discussed the procedure including the risks, benefits and alternatives for the proposed anesthesia with the patient or authorized representative who has indicated his/her understanding and acceptance.   Dental advisory given  Plan Discussed with: CRNA and Surgeon  Anesthesia Plan Comments:         Anesthesia Quick Evaluation

## 2017-07-18 NOTE — ED Notes (Signed)
Pt ambulated to restroom from room, tolerated well. 

## 2017-07-18 NOTE — Op Note (Signed)
PATIENT:  Tracy Petty  37 y.o. female  PRE-OPERATIVE DIAGNOSIS:  cholecystitis  POST-OPERATIVE DIAGNOSIS:  cholecystitis  PROCEDURE:  Procedure(s): LAPAROSCOPIC CHOLECYSTECTOMY   SURGEON:  Surgeon(s): Lyriq Jarchow, De BlanchLuke Aaron, MD  ASSISTANT: none  ANESTHESIA:   local and general  Indications for procedure: Tracy Petty is a 37 y.o. female with symptoms of Abdominal pain and Nausea and vomiting consistent with gallbladder disease, Confirmed by Ultrasound and CT.  Description of procedure: The patient was brought into the operative suite, placed supine. Anesthesia was administered with endotracheal tube. Patient was strapped in place and foot board was secured. All pressure points were offloaded by foam padding. The patient was prepped and draped in the usual sterile fashion.  A small incision was made to the right of the umbilicus. A 5mm trocar was inserted into the peritoneal cavity with optical entry. Pneumoperitoneum was applied with high flow low pressure. 2 5mm trocars were placed in the RUQ. A 12mm trocar was placed in the subxiphoid space. 28 ml marcaine was infused to the subxiphoid space and lateral upper right abdomen in the preperitoneal spaces. Next the patient was placed in reverse trendelenberg. The gallbladder was pink and inflamed and the omentum was adhesed to the gallbladder wall. These adhesions were removed with cautery and blunt dissection.  The gallbladder was retracted cephalad and lateral. The peritoneum was reflected off the infundibulum working lateral to medial. The cystic duct and cystic artery were identified and further dissection revealed a critical view. The cystic duct and cystic artery were doubly clipped and ligated.   The gallbladder was removed off the liver bed with cautery. The Gallbladder was placed in a specimen bag. The gallbladder fossa was irrigated and hemostasis was applied with cautery. The gallbladder was removed via the  12mm trocar. The fascial defect was closed with interrupted 0 vicryl suture via laparoscopic trans-fascial suture passer. Pneumoperitoneum was removed, all trocar were removed. All incisions were closed with 4-0 monocryl subcuticular stitch. The patient woke from anesthesia and was brought to PACU in stable condition. All counts were correct  Findings: inflamed gallbladder  Specimen: gallbladder  Blood loss: 30 ml  Local anesthesia: 28 ml marcaine  Complications: none  PLAN OF CARE: Admit for overnight observation  PATIENT DISPOSITION:  PACU - hemodynamically stable.  Feliciana RossettiLuke Cheyann Blecha, M.D. General, Bariatric, & Minimally Invasive Surgery [facility]

## 2017-07-18 NOTE — ED Notes (Signed)
Surgical team at bedside

## 2017-07-18 NOTE — ED Notes (Signed)
Patient transported to CT 

## 2017-07-18 NOTE — Anesthesia Procedure Notes (Signed)
Procedure Name: Intubation Date/Time: 07/18/2017 1:37 PM Performed by: Dairl PonderJiang, Kateri Balch, CRNA Pre-anesthesia Checklist: Patient identified, Emergency Drugs available, Suction available, Patient being monitored and Timeout performed Patient Re-evaluated:Patient Re-evaluated prior to induction Oxygen Delivery Method: Circle system utilized Preoxygenation: Pre-oxygenation with 100% oxygen Induction Type: IV induction Ventilation: Mask ventilation without difficulty Laryngoscope Size: Miller and 2 Grade View: Grade I Tube type: Oral Tube size: 7.0 mm Number of attempts: 2 Airway Equipment and Method: Stylet Placement Confirmation: ETT inserted through vocal cords under direct vision,  positive ETCO2 and breath sounds checked- equal and bilateral Secured at: 22 cm Tube secured with: Tape Dental Injury: Teeth and Oropharynx as per pre-operative assessment

## 2017-07-19 ENCOUNTER — Encounter (HOSPITAL_COMMUNITY): Payer: Self-pay | Admitting: General Surgery

## 2017-07-19 MED ORDER — HYDROCODONE-ACETAMINOPHEN 5-325 MG PO TABS: 1.0000 | ORAL_TABLET | ORAL | 0 refills | Status: DC | PRN

## 2017-07-19 NOTE — Discharge Instructions (Signed)
CIRUGIA LAPAROSCOPICA: INSTRUCCIONES DE POST OPERATORIO. ° °Revise siempre los documentos que le entreguen en el lugar donde se ha hecho la cirugia. ° °SI USTED NECESITA DOCUMENTOS DE INCAPACIDAD (DISABLE) O DE PERMISO FAMILAR (FAMILY LEAVE) NECESITA TRAERLOS A LA OFICINA PARA QUE SEAN PROCESADOS. °NO  SE LOS DE A SU DOCTOR. °1. A su alta del hospital se le dara una receta para controlar el dolor. Tomela como ha sido recetada, si la necesita. Si no la necesita puede tomar, Acetaminofen (Tylenol) o Ibuprofen (Advil) para aliviar dolor moderado. °2. Continue tomando el resto de sus medicinas. °3. Si necesita rellenar la receta, llame a la farmacia. ellos contactan a nuestra oficina pidiendo autorizacion. Este tipo de receta no pueden ser rellenadas despues de las  5pm o durante los fines de semana. °4. Con relacion a la dieta: debe ser ligera los primeros dias despues que llege a la casa. Ejemplo: sopas y galleticas. Tome bastante liquido esos dias. °5. La mayoria de los pacientes padecen de inflamacion y cambio de coloracion de la piel alrededor de las incisiones. esto toma dias en resolver.  pnerse una bolsa de hielo en el area affectada ayuda..  °6. Es comun tambien tener un poco de estrenimiento si esta tomado medicinas para el dolor. incremente la cantidad de liquidos a tomar y puede tomar (Colace) esto previene el problema. Si ya tiene estrenimiento, es decir no ha defecado en 48 horas, puede tomar un laxativo (Milk of Magnesia or Miralax) uselo como el paquete le explica. °7.  A menos que se le diga algo diferente. Remueva el bendaje a las 24-48 horas despues dela cirugia. y puede banarse en la ducha sin ningun problema. usted puede tener steri-strips (pequenas curitas transparentes en la piel puesta encima de la incision)  Estas banditas strips should be left on the skin for 7-10 days.   Si su cirujano puso pegamento encima de la incision usted puede banarse bajo la ducha en 24 horas. Este pegamento empezara a  caerse en las proximas 2-3 semanas. Si le pusieron suturas o presillas (grapos) estos seran quitados en su proxima cita en la oficina. . °a. ACTIVIDADES:  Puede hacer actividad ligera.  Como caminar , subir escaleras y poco a poco irlas incrementando tanto como las tolere. Puede tener relaciones sexuales cuando sea comfortable. No carge objetos pesados o haga esfuerzos que no sean aprovados por su doctor. °b. Puede manejar en cuanto no esta tomando medicamentos fuertes (narcoticos) para el dolor, pueda abrochar confortablemente el cinturon de seguridad, y pueda maniobrar y usar los pedales de su vehiculo con seguridad. °c. PUEDE REGRESAR A TRABAJAR  °8. Debe ver a su doctor para una cita de seguimiento en 2-3 semanas despues de la cirugia.  °9. OTRAS ISNSTRUCCIONES:___________________________________________________________________________________ °CUANDO LLAMAR A SU MEDICO: °1. FIEBRE mayor de  101.0 °2. No produccion de orina. °3. Sangramiento continue de la herida °4. Incremento de dolor, enrojecimientio o drenaje de la herida (incision) °5. Incremento de dolor abdominal. ° °The clinic staff is available to answer your questions during regular business hours.  Please don’t hesitate to call and ask to speak to one of the nurses for clinical concerns.  If you have a medical emergency, go to the nearest emergency room or call 911.  A surgeon from [facility] is always on call at the hospital. °[address], [address], [place], [place]  [address] ? [address] [address], [place], [place]   [address] °[phone] ? [phone] ? FAX [phone] °Web site: [email or web address] ° ° °

## 2017-07-19 NOTE — Discharge Summary (Signed)
[facility]   Patient ID: Tracy JohannFlor A Godinez Petty MRN: [id number] DOB/AGE: 38/01/1981 36 y.o.  Admit date: 07/18/2017 Discharge date: 07/19/2017  Admitting Diagnosis: cholecystitis  Discharge Diagnosis Patient Active Problem List   Diagnosis Date Noted  . Acute cholecystitis 07/18/2017    Consultants none  Imaging: Ct Abdomen Pelvis W Contrast  Result Date: 07/18/2017 CLINICAL DATA:  Right upper quadrant abdominal pain. EXAM: CT ABDOMEN AND PELVIS WITH CONTRAST TECHNIQUE: Multidetector CT imaging of the abdomen and pelvis was performed using the standard protocol following bolus administration of intravenous contrast. CONTRAST:  100mL ISOVUE-300 IOPAMIDOL (ISOVUE-300) INJECTION 61% COMPARISON:  Right upper quadrant ultrasound 06/03/2017. FINDINGS: Lower chest: Normal heart size. Lung bases are clear. No pleural effusion. Hepatobiliary: Liver is normal in size and contour. No focal hepatic lesion is identified. There is wall thickening of the gallbladder with small amount of surrounding fat stranding. No intrahepatic or extrahepatic biliary ductal dilatation. Pancreas: Unremarkable Spleen: Unremarkable Adrenals/Urinary Tract: Adrenal glands are normal. Kidneys enhance symmetrically with contrast. No hydronephrosis. Urinary bladder is unremarkable. Stomach/Bowel: No abnormal bowel wall thickening or evidence for bowel obstruction. No free fluid or free intraperitoneal air. Normal morphology of the stomach. Normal appendix. Vascular/Lymphatic: Normal caliber abdominal aorta. No retroperitoneal lymphadenopathy. Reproductive: Uterus and adnexal structures are unremarkable. Other: None. Musculoskeletal: No aggressive or acute appearing osseous lesions. Lumbar spine degenerative changes. IMPRESSION: Gallbladder wall thickening and surrounding fat stranding/fluid raising the possibility of acute cholecystitis. Consider further evaluation with right upper quadrant  ultrasound. Electronically Signed   By: Annia Beltrew  Davis M.D.   On: 07/18/2017 09:57    Procedures Dr. Sheliah HatchKinsinger (07/18/17) - Laparoscopic Cholecystectomy   Hospital Course:  Pt is a 37 yo spanish speaking female [facility].  Workup showed cholecystitis.  Patient was admitted and underwent procedure listed above.  Tolerated procedure well and was transferred to the floor.  Diet was advanced as tolerated.  On POD#1, the patient was voiding well, tolerating diet, ambulating well, pain well controlled, vital signs stable, incisions c/d/i and felt stable for discharge home. Str[employer] interpreter was used. Patient will follow up in our office in 2 weeks and knows to call with questions or concerns.      Patient was discharged in good condition.  The [place] Substance controlled database was reviewed prior to prescribing narcotic pain medication to this patient.  Physical Exam: General:  Alert, NAD, pleasant, cooperative Cardio: RRR, S1 & S2 normal, no murmur, rubs, gallops Resp: Effort normal, lungs CTA bilaterally, no wheezes, rales, rhonchi Abd:  Soft, ND, normal bowel sounds, incisions with glue intact appear well healing without signs of infection, mild generalized TTP without guarding and no peritonitis.   Skin: warm and dry, no rashes noted  Allergies as of 07/19/2017   No Known Allergies     Medication List    TAKE these medications   HYDROcodone-acetaminophen 5-325 MG tablet Commonly known as:  NORCO/VICODIN Take 1-2 tablets by mouth every 4 (four) hours as needed for moderate pain.   meloxicam 7.5 MG tablet Commonly known as:  MOBIC Take 1 tablet (7.5 mg total) by mouth daily.   Omega-3 1000 MG Caps Take 1,000 mg by mouth daily.   polyethylene glycol powder powder Commonly known as:  MIRALAX Place 1 capfull of powder in 8 oz of water or gatorade and consume daily. If you are unable to have a bowel movement, increase dosage to 2 capfulls daily.  Do not use for more  than 1 week without consulting your doctor.       Follow-up Information    [facility], [place]. Call.   Specialty:  General Surgery Why:  to see when your follow up appointment is Contact information: [address] [place] [address] [phone]           Signed: Joyce Copa [facility] 07/19/2017, 8:31 AM Pager: [phone] Consults: [phone] Mon-Fri 7:00 am-4:30 pm Sat-Sun 7:00 am-11:30 am

## 2019-02-18 IMAGING — DX DG ABDOMEN ACUTE W/ 1V CHEST
3 series · 3 of 3 positions shown · non-contrast
Comparison: None.

CLINICAL DATA: Constipation

EXAM:
DG ABDOMEN ACUTE W/ 1V CHEST

[chest pa]
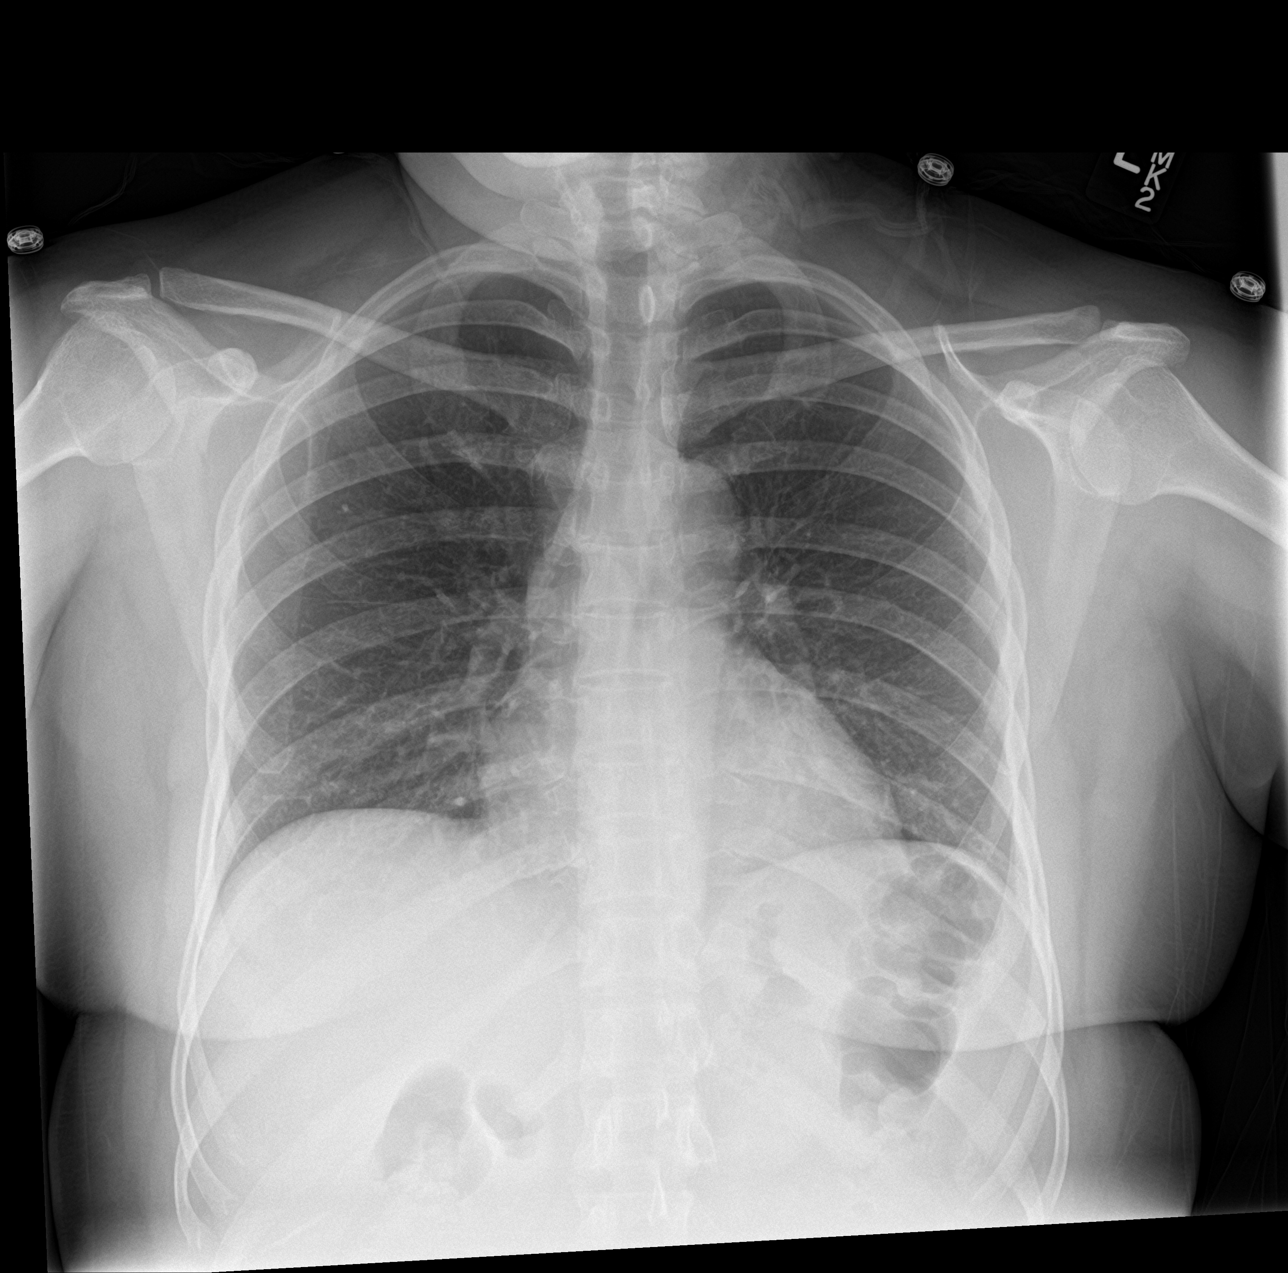

[abdomen erect]
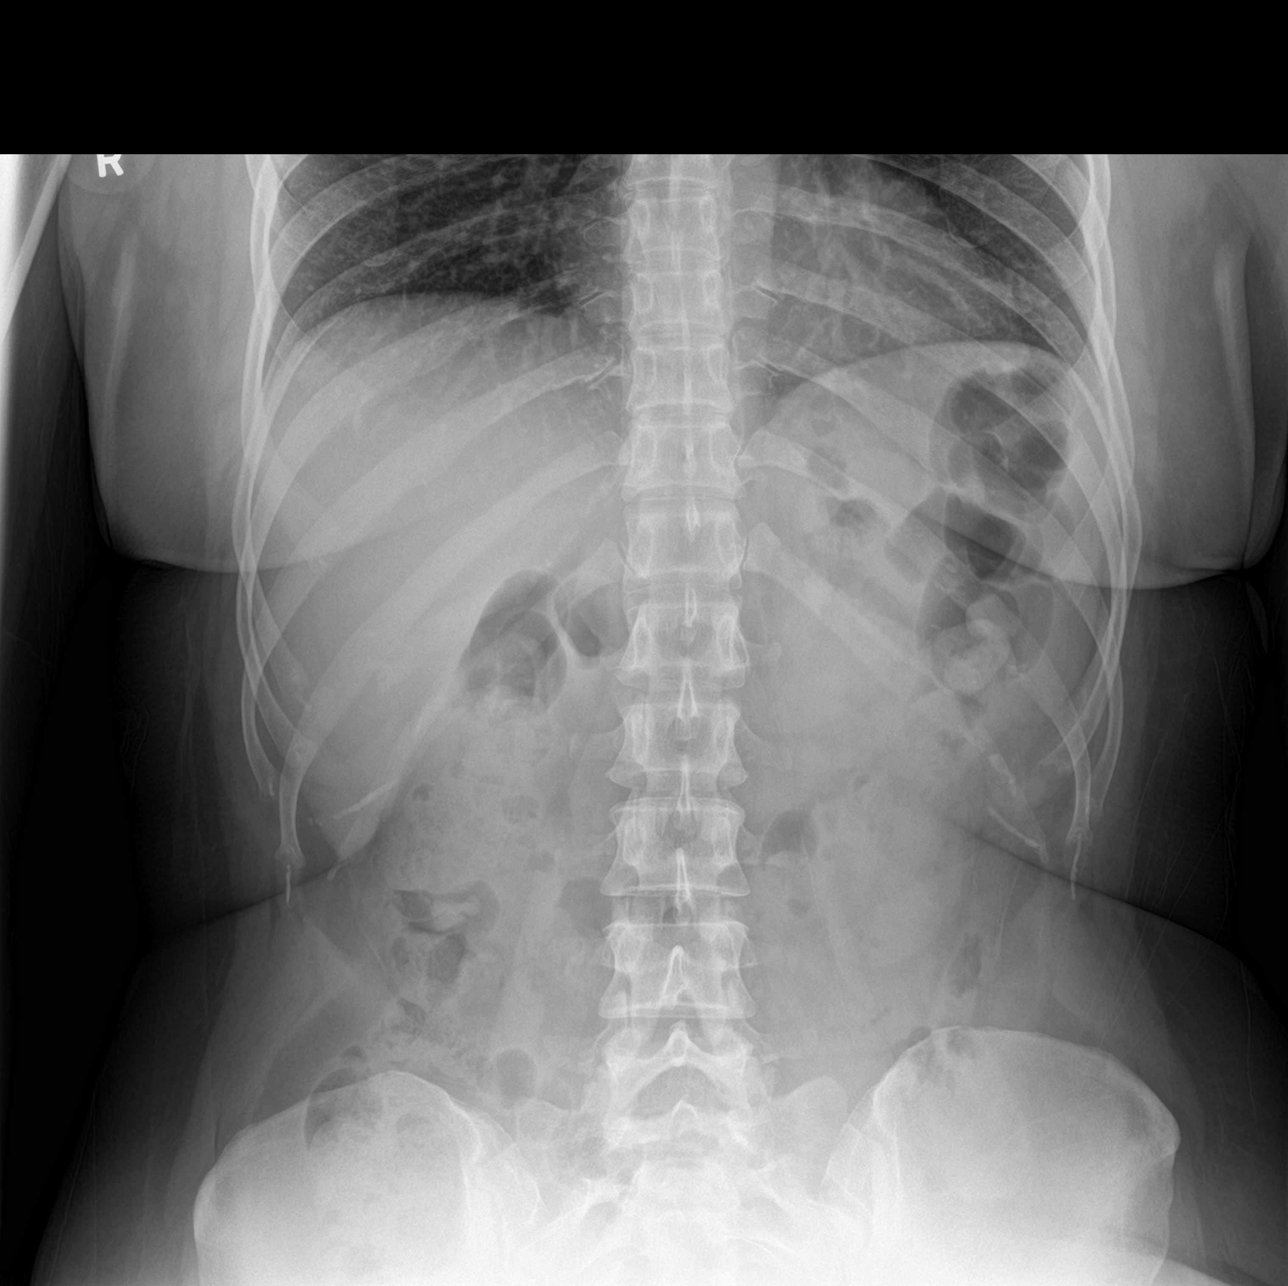

[abdomen supine]
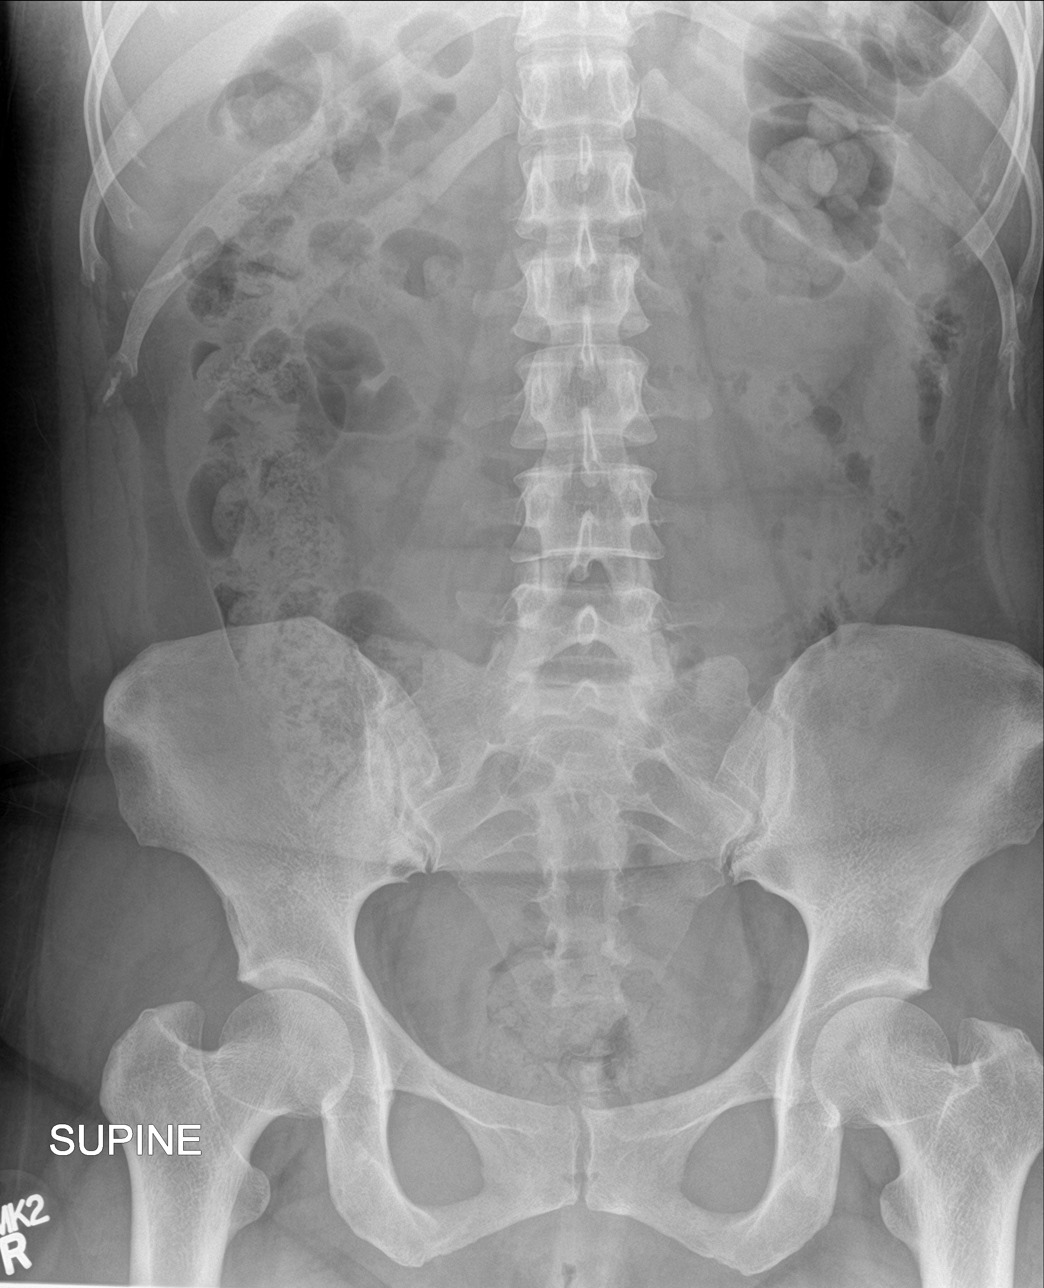

[3 of 3 positions shown; findings below may reference images not displayed]

FINDINGS: Normal heart size. Normal mediastinal contour. No pneumothorax. No
pleural effusion. Lungs appear clear, with no acute consolidative
airspace disease and no pulmonary edema. No dilated small bowel
loops or air-fluid levels. Mild to moderate colorectal stool volume.
No evidence of pneumatosis or pneumoperitoneum. No radiopaque
urolithiasis.
IMPRESSION: 1. No active disease in the chest .
2. Nonobstructive bowel gas pattern.
3. Mild to moderate colorectal stool volume.

## 2019-08-07 IMAGING — US US ABDOMEN COMPLETE
1 series · 14 of 25 positions shown · non-contrast
Comparison: None.

CLINICAL DATA: Epigastric abdominal pain for 3 months.

EXAM:
ABDOMEN ULTRASOUND COMPLETE

[Series 1: us abdomen complete · 0.22mm/px · 14 of 100 slices shown]
[im 1/100]
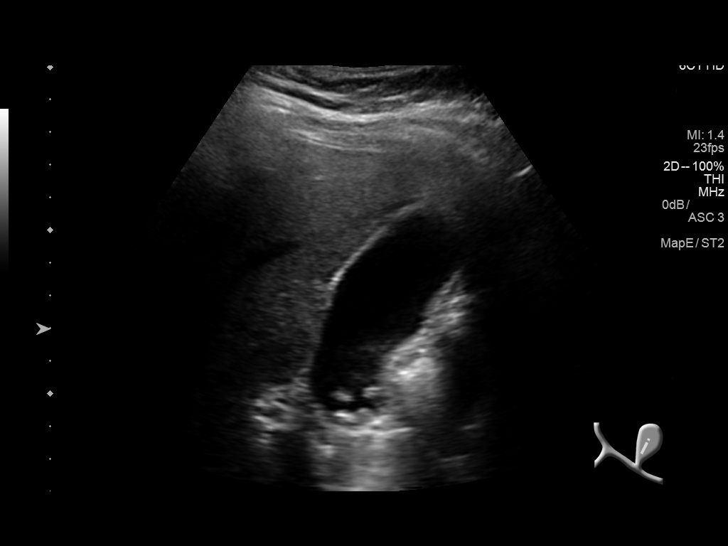
[im 9/100]
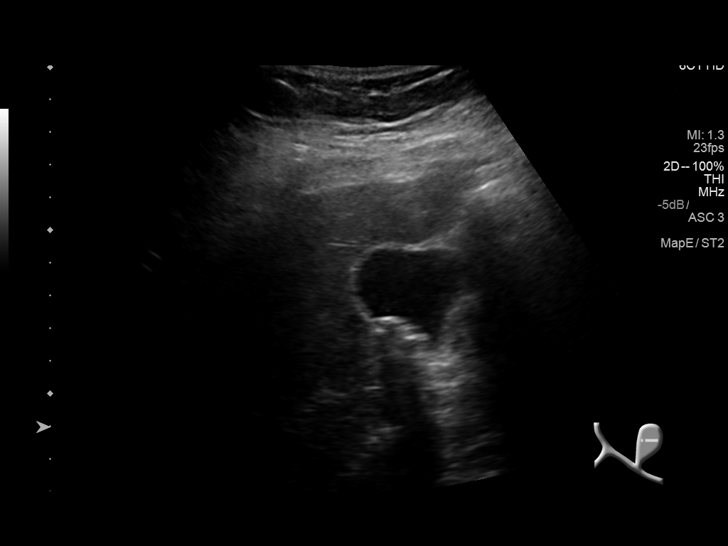
[im 17/100]
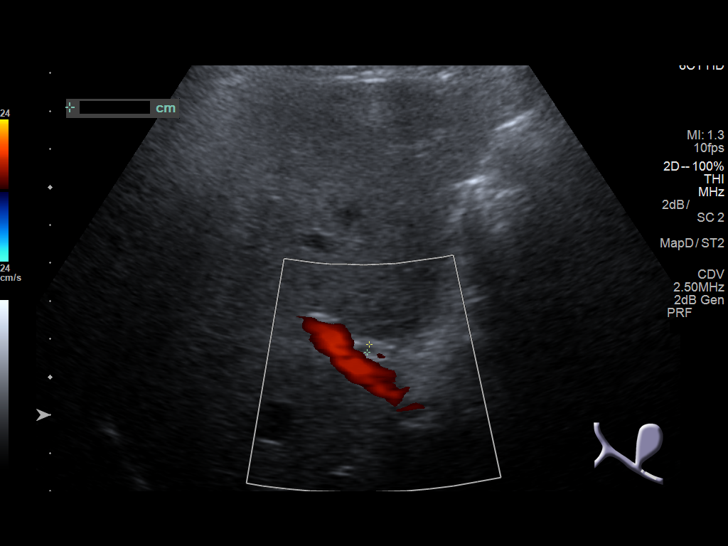
[im 25/100]
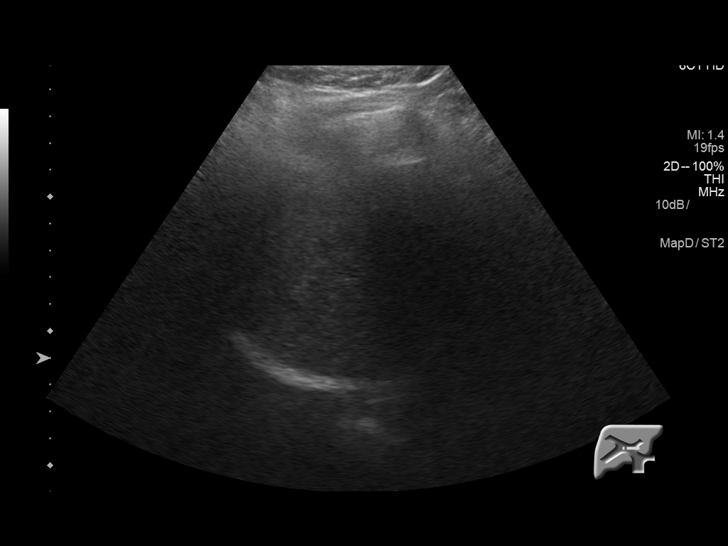
[im 34/100]
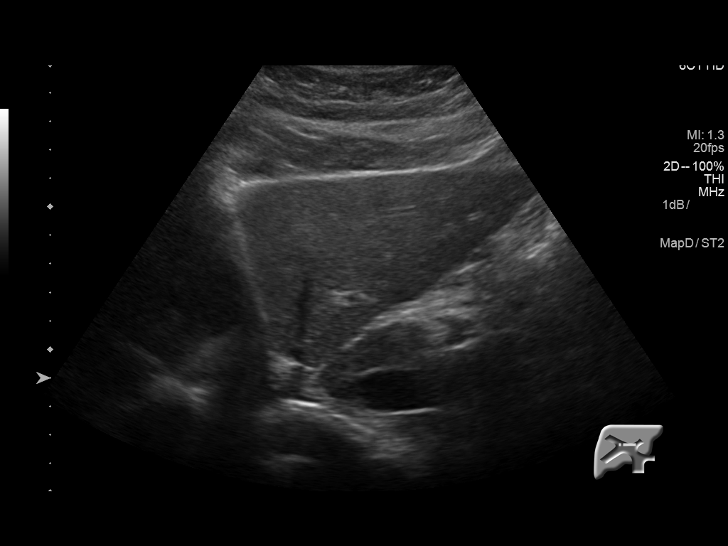
[im 38/100]
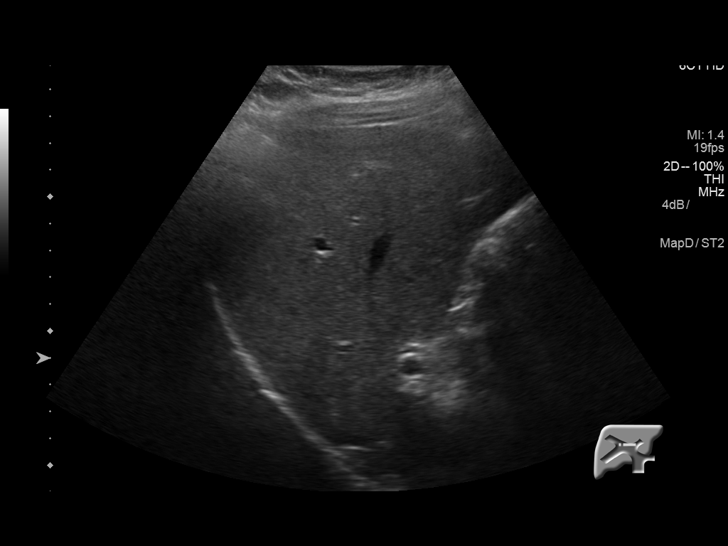
[im 46/100]
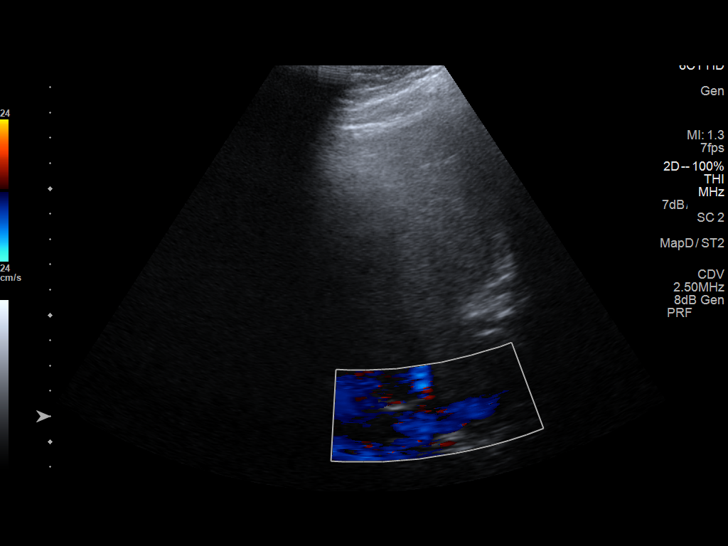
[im 54/100]
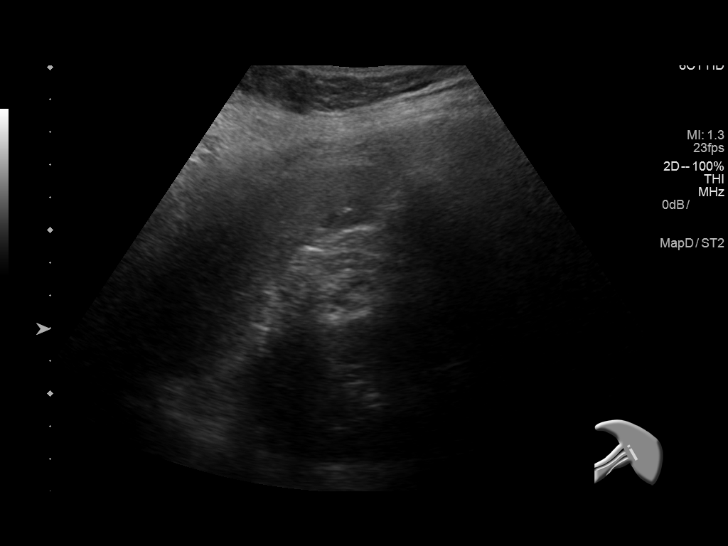
[im 62/100]
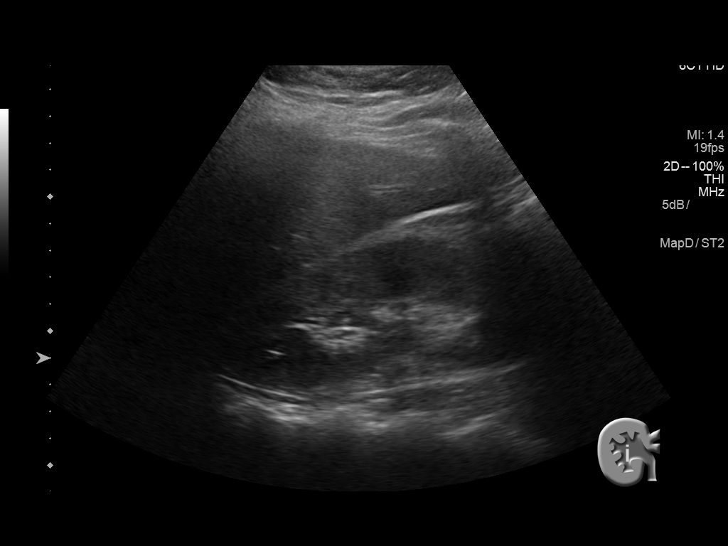
[im 67/100]
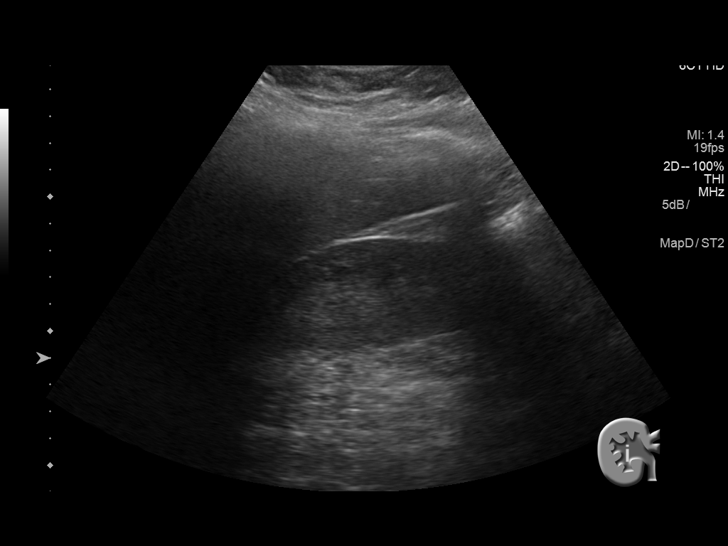
[im 75/100]
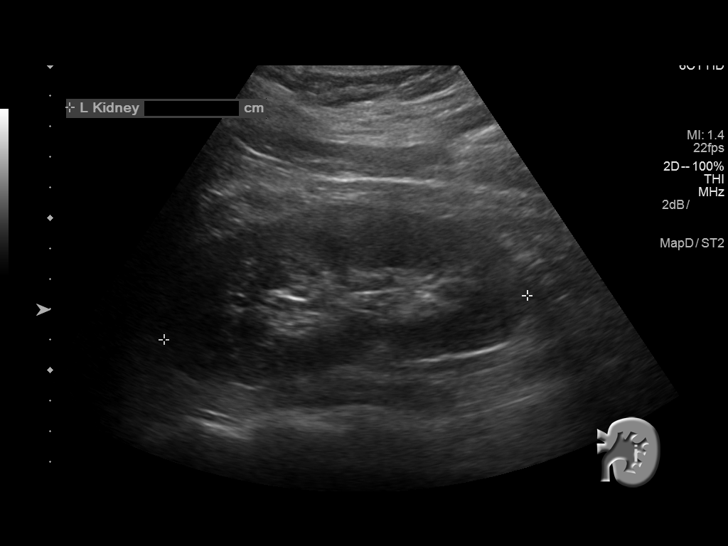
[im 83/100]
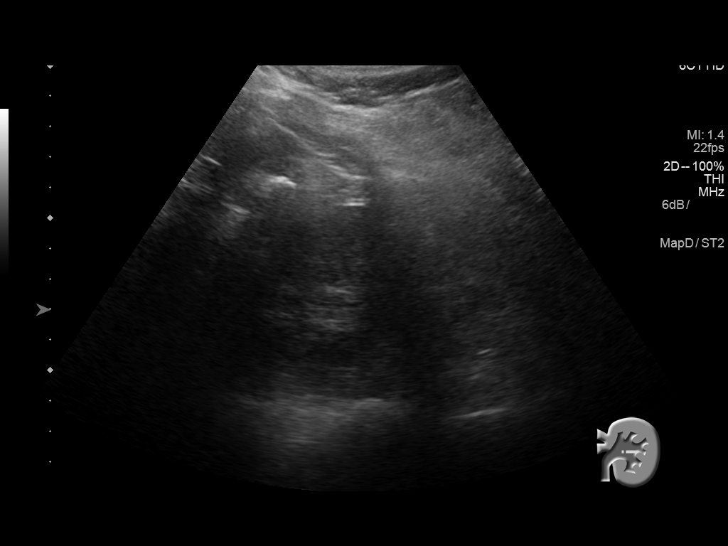
[im 91/100]
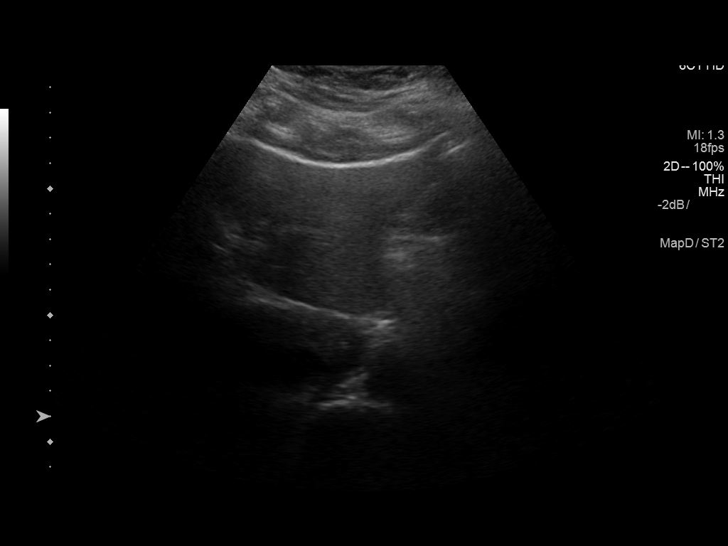
[im 100/100]
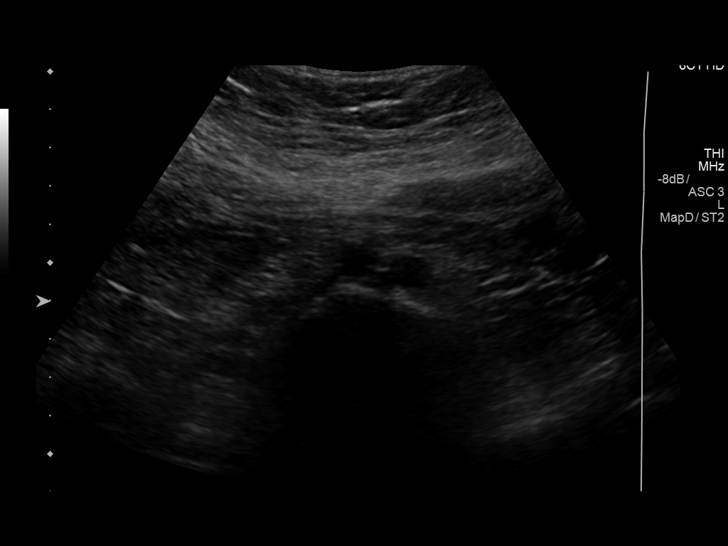

[14 of 25 positions shown; findings below may reference images not displayed]

FINDINGS: Gallbladder: Multiple small less than 1 cm gallstones noted. No
evidence of gallbladder wall thickening or pericholecystic fluid. No
sonographic Murphy sign noted by sonographer.

Common bile duct: Diameter: 2 mm, within normal limits.

Liver: No focal lesion identified. Within normal limits in
parenchymal echogenicity. Portal vein is patent on color Doppler
imaging with normal direction of blood flow towards the liver.

IVC: No abnormality visualized.

Pancreas: Visualized portion unremarkable.

Spleen: Size and appearance within normal limits.

Right Kidney: Length: 12.0 cm. Echogenicity within normal limits. No
mass or hydronephrosis visualized.

Left Kidney: Length: 12.0 cm. Echogenicity within normal limits. No
mass or hydronephrosis visualized.

Abdominal aorta: No aneurysm visualized.

Other findings: None.
IMPRESSION: Cholelithiasis.

No evidence of cholecystitis, biliary ductal dilatation, or other
acute findings.

## 2019-12-18 ENCOUNTER — Emergency Department (HOSPITAL_COMMUNITY)
Admission: EM | Admit: 2019-12-18 | Discharge: 2019-12-19 | Disposition: A | Discharge status: Home / Self Care | Payer: Self-pay | Attending: Emergency Medicine | Admitting: Emergency Medicine

## 2019-12-18 ENCOUNTER — Emergency Department (HOSPITAL_COMMUNITY): Payer: Self-pay

## 2019-12-18 ENCOUNTER — Encounter (HOSPITAL_COMMUNITY): Payer: Self-pay | Admitting: Emergency Medicine

## 2019-12-18 DIAGNOSIS — R0789 Other chest pain: Secondary | ICD-10-CM | POA: Insufficient documentation

## 2019-12-18 DIAGNOSIS — I4510 Unspecified right bundle-branch block: Secondary | ICD-10-CM | POA: Insufficient documentation

## 2019-12-18 DIAGNOSIS — Z79899 Other long term (current) drug therapy: Secondary | ICD-10-CM | POA: Insufficient documentation

## 2019-12-18 DIAGNOSIS — Z87891 Personal history of nicotine dependence: Secondary | ICD-10-CM | POA: Insufficient documentation

## 2019-12-18 LAB — BASIC METABOLIC PANEL
Anion gap: 13 (ref 5–15)
BUN: 10 mg/dL (ref 6–20)
CO2: 20 mmol/L — ABNORMAL LOW (ref 22–32)
Calcium: 9 mg/dL (ref 8.9–10.3)
Chloride: 104 mmol/L (ref 98–111)
Creatinine, Ser: 0.64 mg/dL (ref 0.44–1.00)
GFR calc Af Amer: >60 mL/min (ref >60)
GFR calc non Af Amer: >60 mL/min (ref >60)
Glucose, Bld: 112 mg/dL — ABNORMAL HIGH (ref 70–99)
Potassium: 3.6 mmol/L (ref 3.5–5.1)
Sodium: 137 mmol/L (ref 135–145)

## 2019-12-18 LAB — I-STAT BETA HCG BLOOD, ED (MC, WL, AP ONLY): I-stat hCG, quantitative: <5 m[IU]/mL (ref <5)

## 2019-12-18 LAB — CBC
HCT: 35.9 % — ABNORMAL LOW (ref 36.0–46.0)
Hemoglobin: 11.1 g/dL — ABNORMAL LOW (ref 12.0–15.0)
MCH: 23.2 pg — ABNORMAL LOW (ref 26.0–34.0)
MCHC: 30.9 g/dL (ref 30.0–36.0)
MCV: 74.9 fL — ABNORMAL LOW (ref 80.0–100.0)
Platelets: 298 10*3/uL (ref 150–400)
RBC: 4.79 MIL/uL (ref 3.87–5.11)
RDW: 15.7 % — ABNORMAL HIGH (ref 11.5–15.5)
WBC: 10.1 10*3/uL (ref 4.0–10.5)
nRBC: 0 % (ref 0.0–0.2)

## 2019-12-18 LAB — TROPONIN I (HIGH SENSITIVITY)
Troponin I (High Sensitivity): 4 ng/L (ref <18)
Troponin I (High Sensitivity): 2 ng/L (ref <18)

## 2019-12-18 NOTE — ED Triage Notes (Signed)
Pt c/o L sided non radiating CP onset 8 days ago, new onset HTN new RBBB. No relief with nitro ASA 324 given. #20 L hand.

## 2019-12-19 ENCOUNTER — Other Ambulatory Visit: Payer: Self-pay

## 2019-12-19 NOTE — ED Provider Notes (Signed)
[facility] EMERGENCY DEPARTMENT Provider Note   CSN: [id number] Arrival date & time: 12/18/19  1646     History Chief Complaint  Patient presents with  . 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 female with history of cholecystectomy presents to ER for evaluation of chest pain for the last 8 days.  She went to a local clinic and had an EKG done, was told it was abnormal and EMS was called who transported patient to ER.  Patient has been in ER waiting room for >15 hours. Describes the pain as "pressure".  Gestures to her upper chest central, bilateral sides.  The last time she felt it was yesterday afternoon.  The pain is intermittent.  It can last up to one hour.  She feels associated shortness of breath because she feels like she is breathing against pressure, palpitations.  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  The discomfort also happens at rest.  Denies associated nausea, vomiting, abdominal pain.  No back pain. No extremity pain, tingling or numbness. No light-headedness, syncope.  No lower extremity or calf pain or swelling.  No pleuritic pain.  Denies fever, cough, sore throat, congestion. Fully vaccinated for COVID.  Reports once being told had high cholesterol but this resolved.  No known cardiac history. No smoking, HTN, diabetes. No history of DVT/PE.  No recent surgery, prolonged immobilization, cancer, hormone therapy, hemoptysis.  No post prandrial symptoms. Recent EGD that was normal.   HPI     Past Medical History:  Diagnosis Date  . Allergy    spring  . High cholesterol     Patient Active Problem List   Diagnosis Date Noted  . Acute cholecystitis 07/18/2017    Past Surgical History:  Procedure Laterality Date  . CHOLECYSTECTOMY N/A 07/18/2017   Procedure: LAPAROSCOPIC CHOLECYSTECTOMY;  Surgeon: Kinsinger, De Blanch, MD;  Location:  [facility] OR;  Service: General;  Laterality: N/A;  . NSVD     x1     OB History   No obstetric history on file.     Family History  Problem Relation Age of Onset  . Heart disease Neg Hx   . Colon cancer Neg Hx   . Diabetes Neg Hx   . Colon polyps Neg Hx   . Rectal cancer Neg Hx   . Stomach cancer Neg Hx     Social History   Tobacco Use  . Smoking status: Former Games developer  . Smokeless tobacco: Never Used  Vaping Use  . Vaping Use: Never used  Substance Use Topics  . Alcohol use: Yes    Alcohol/week: 0.0 standard drinks    Comment: occasionally   . Drug use: No    Home Medications Prior to Admission medications   Medication Sig Start Date End Date Taking? Authorizing Provider  albuterol (VENTOLIN HFA) 108 (90 Base) MCG/ACT inhaler Inhale 2 puffs into the lungs as needed for wheezing or shortness of breath.   Yes [provider]  HYDROcodone-acetaminophen (NORCO/VICODIN) 5-325 MG tablet Take 1-2 tablets by mouth every 4 (four) hours as needed for moderate pain. Patient not taking: Reported on 12/19/2019 07/19/17   Jerre Simon, PA  meloxicam (MOBIC) 7.5 MG tablet Take 1 tablet (7.5 mg total) by mouth daily. Patient not taking: Reported on 05/25/2017 10/05/15   Trena Platt D, PA  polyethylene glycol powder (  MIRALAX) powder Place 1 capfull of powder in 8 oz of water or gatorade and consume daily. If you are unable to have a bowel movement, increase dosage to 2 capfulls daily. Do not use for more than 1 week without consulting your doctor. Patient not taking: Reported on 05/25/2017 08/01/16   Charlie Pitter, MD    Allergies    Patient has no known allergies.  Review of Systems   Review of Systems  Respiratory: Positive for shortness of breath.   Cardiovascular: Positive for chest pain and palpitations.  All other systems reviewed and are negative.   Physical Exam Updated Vital Signs BP 127/74 (BP Location: Right Arm)   Pulse 64   Temp 98.5 F (36.9 C)  (Oral)   Resp 18   Ht 5' (1.524 m)   Wt 74 kg   SpO2 100%   BMI 31.86 kg/m   Physical Exam Constitutional:      Appearance: She is well-developed.     Comments: NAD. Non toxic.   HENT:     Head: Normocephalic and atraumatic.     Nose: Nose normal.  Eyes:     General: Lids are normal.     Conjunctiva/sclera: Conjunctivae normal.  Neck:     Trachea: Trachea normal.     Comments: Trachea midline.  Cardiovascular:     Rate and Rhythm: Normal rate and regular rhythm.     Pulses:          Radial pulses are 1+ on the right side and 1+ on the left side.       Dorsalis pedis pulses are 1+ on the right side and 1+ on the left side.     Heart sounds: Normal heart sounds, S1 normal and S2 normal.     Comments: No murmurs. No LE edema or calf tenderness.  Pulmonary:     Effort: Pulmonary effort is normal.     Breath sounds: Normal breath sounds.     Comments: No chest wall tenderness  Abdominal:     General: Bowel sounds are normal.     Palpations: Abdomen is soft.     Tenderness: There is no abdominal tenderness.     Comments: No epigastric or upper abdominal tenderness.  Musculoskeletal:     Cervical back: Normal range of motion.  Skin:    General: Skin is warm and dry.     Capillary Refill: Capillary refill takes less than 2 seconds.     Comments: No rash to chest wall  Neurological:     Mental Status: She is alert.     GCS: GCS eye subscore is 4. GCS verbal subscore is 5. GCS motor subscore is 6.     Comments: Sensation and strength intact in upper/lower extremities  Psychiatric:        Speech: Speech normal.        Behavior: Behavior normal.        Thought Content: Thought content normal.     ED Results / Procedures / Treatments   Labs (all labs ordered are listed, but only abnormal results are displayed) Labs Reviewed  BASIC METABOLIC PANEL - Abnormal; Notable for the following components:      Result Value   CO2 20 (*)    Glucose, Bld 112 (*)    All other  components within normal limits  CBC - Abnormal; Notable for the following components:   Hemoglobin 11.1 (*)    HCT 35.9 (*)    MCV 74.9 (*)  MCH 23.2 (*)    RDW 15.7 (*)    All other components within normal limits  I-STAT BETA HCG BLOOD, ED ([facility], [facility], [facility] ONLY)  TROPONIN I (HIGH SENSITIVITY)  TROPONIN I (HIGH SENSITIVITY)    EKG EKG Interpretation  Date/Time:  Tuesday December 19 2019 10:41:18 EDT Ventricular Rate:  71 PR Interval:  142 QRS Duration: 120 QT Interval:  412 QTC Calculation: 447 R Axis:   60 Text Interpretation: Normal sinus rhythm Right bundle branch block No significant change since last tracing Confirmed by Gwyneth Sprout ([id number]) on 12/19/2019 11:02:46 AM   Radiology DG Chest 2 View  Result Date: 12/18/2019 CLINICAL DATA:  Mid chest pain and pressure, fatigue EXAM: CHEST - 2 VIEW COMPARISON:  None. FINDINGS: The heart size and mediastinal contours are within normal limits. Both lungs are clear. The visualized skeletal structures are unremarkable. IMPRESSION: No active cardiopulmonary disease. Electronically Signed   By: Sharlet Salina M.D.   On: 12/18/2019 17:53    Procedures Procedures (including critical care time)  Medications Ordered in ED Medications - No data to display  ED Course  I have reviewed the triage vital signs and the nursing notes.  Pertinent labs & imaging results that were available during my care of the patient were reviewed by me and considered in my medical decision making (see chart for details).    MDM Rules/Calculators/A&P                          Pt is a 39 y.o. female presents with what sounds like atypical CP.   CP worse while at work, moving, lifting heavy objects.  CP free in ER. Last CP > 12 hr ago. CP non pleuritic, non positional. No associated concerning features such as fever, cough, SOB. No recent illnesses. Some palpitations but no light-headedness, syncope, leg swelling/calf pain to suggest DVT.  No cardiac risk  factors like *HTN, hypercholesterolemia, DM, obesity, smoking, positive family hx, known CAD.    VS WNL and stable. CV and pulmonary exam benign. There is no reproducible CP with palpation and position changes.  No LE edema or calf tenderness. No epigastric tenderness. No neuro or pulse deficits.   Patient in ER for more than 12 hours unfortunately.  Work up initiated in triage, personally reviewed and interpreted.  Ordered repeat EKG.   EKG shows new RBBB.  EKG reviewed with EDP.  Previous EKG show some widening to a lesser degree of QRS.  Hs-trop x 2 undetectable.  No risk factors for PE/DVT, Wells Score/PERC negative. Heart score is 2.  No CP in ER/No return of CP in ER.    Given symptomatology, exam, non ischemic cardiac work up in ER and HEART score patient is appropriate for discharge with PCP/cardiology f/u.  Work up not suggestive of symptomatic anemia, PE, PTX, dissection, ACS.  Likely atypical chest pain, possibly MSK etiology vs pleurisy vs costochondritis vs GI related vs other. ED return preacutions given. Pt appears reliable for follow up, aware of symptoms that would warrant return to ER.  Pt is comfortable and agreeable with ER POC and discharge plan.   Final Clinical Impression(s) / ED Diagnoses Final diagnoses:  Atypical chest pain  Right bundle branch block    Rx / DC Orders ED Discharge Orders    None       Liberty Handy, PA-C 12/19/19 1150    Gwyneth Sprout, MD 12/19/19 1158

## 2019-12-19 NOTE — Discharge Instructions (Addendum)
You were seen in the ER for chest pressure, palpitations  EKG showed right bundle branch block.  This is new but last EKGs in our system showed early changed of right bundle branch block.  Everything else today was normal.   The cause of your symptoms in unclear but we do not think it is related to your heart or lungs  Monitor your symptoms.  Take acetaminophen 500 mg as needed for chest discomfort.  Call your primary care doctor for further discussion of your symptoms.  You can call cardiology clinic directly if you would like formal cardiac evaluation  Usted vino a la sala de emergencias por presin en el pecho, palpitaciones  El electrocardiograma mostr bloqueo de rama derecha. Esto es mas pronunciado hoy dia sin embargo hace varios aos su electrocardiograma ya mostraba este cambio pero en un grado mas leve. Todo lo dems examenes hoy fueron normales.  La causa de sus sntomas no est clara, pero no creemos que est relacionada con su corazn o pulmones.  Monitoree sus sntomas. Tome acetaminofn 500 mg segn sea necesario para las International aid/development worker.  Llame a su mdico de atencin primaria para obtener ms informacin sobre sus sntomas. Puede llamar a la clnica de cardiologa directamente si desea una evaluacin cardaca formal

## 2019-12-19 NOTE — ED Notes (Signed)
Pt discharge instructions and follow up reviewed with the patient. The patient verbalized understanding. Pt discharged. 

## 2020-02-20 ENCOUNTER — Encounter (HOSPITAL_COMMUNITY): Payer: Self-pay | Admitting: Emergency Medicine

## 2020-02-20 ENCOUNTER — Other Ambulatory Visit: Payer: Self-pay

## 2020-02-20 ENCOUNTER — Emergency Department (HOSPITAL_COMMUNITY): Payer: Self-pay

## 2020-02-20 ENCOUNTER — Emergency Department (HOSPITAL_COMMUNITY)
Admission: EM | Admit: 2020-02-20 | Discharge: 2020-02-21 | Disposition: A | Discharge status: Home / Self Care | Payer: Self-pay | Attending: Emergency Medicine | Admitting: Emergency Medicine

## 2020-02-20 DIAGNOSIS — R002 Palpitations: Secondary | ICD-10-CM | POA: Insufficient documentation

## 2020-02-20 DIAGNOSIS — R0602 Shortness of breath: Secondary | ICD-10-CM | POA: Insufficient documentation

## 2020-02-20 DIAGNOSIS — Z87891 Personal history of nicotine dependence: Secondary | ICD-10-CM | POA: Insufficient documentation

## 2020-02-20 LAB — BASIC METABOLIC PANEL
Anion gap: 11 (ref 5–15)
BUN: 9 mg/dL (ref 6–20)
CO2: 21 mmol/L — ABNORMAL LOW (ref 22–32)
Calcium: 8.8 mg/dL — ABNORMAL LOW (ref 8.9–10.3)
Chloride: 104 mmol/L (ref 98–111)
Creatinine, Ser: 0.58 mg/dL (ref 0.44–1.00)
GFR, Estimated: >60 mL/min (ref >60)
Glucose, Bld: 103 mg/dL — ABNORMAL HIGH (ref 70–99)
Potassium: 3.4 mmol/L — ABNORMAL LOW (ref 3.5–5.1)
Sodium: 136 mmol/L (ref 135–145)

## 2020-02-20 LAB — I-STAT BETA HCG BLOOD, ED (MC, WL, AP ONLY): I-stat hCG, quantitative: <5 m[IU]/mL (ref <5)

## 2020-02-20 LAB — CBC
HCT: 35.7 % — ABNORMAL LOW (ref 36.0–46.0)
Hemoglobin: 10.9 g/dL — ABNORMAL LOW (ref 12.0–15.0)
MCH: 23 pg — ABNORMAL LOW (ref 26.0–34.0)
MCHC: 30.5 g/dL (ref 30.0–36.0)
MCV: 75.5 fL — ABNORMAL LOW (ref 80.0–100.0)
Platelets: 360 10*3/uL (ref 150–400)
RBC: 4.73 MIL/uL (ref 3.87–5.11)
RDW: 16.5 % — ABNORMAL HIGH (ref 11.5–15.5)
WBC: 9 10*3/uL (ref 4.0–10.5)
nRBC: 0 % (ref 0.0–0.2)

## 2020-02-20 LAB — TROPONIN I (HIGH SENSITIVITY): Troponin I (High Sensitivity): <2 ng/L (ref <18)

## 2020-02-20 NOTE — ED Triage Notes (Signed)
Per pt, she feels poorly due to her hypertension.  She reports going to her Dr, who prescribed her medications however they are not working. Pt describes general weakness, headache, some SOB and "sometimes my heart goes fast."

## 2020-02-21 ENCOUNTER — Emergency Department (HOSPITAL_COMMUNITY): Payer: Self-pay

## 2020-02-21 LAB — TROPONIN I (HIGH SENSITIVITY): Troponin I (High Sensitivity): <2 ng/L (ref <18)

## 2020-02-21 MED ORDER — ACETAMINOPHEN 500 MG PO TABS
1000.0000 mg | ORAL_TABLET | Freq: Once | ORAL | Status: AC
  Administered 2020-02-21: 1000 mg via ORAL
  Filled 2020-02-21: qty 2

## 2020-02-21 MED ORDER — IOHEXOL 350 MG/ML SOLN
75.0000 mL | Freq: Once | INTRAVENOUS | Status: AC | PRN
  Administered 2020-02-21: 75 mL via INTRAVENOUS

## 2020-02-21 NOTE — ED Notes (Addendum)
Spanish interpretor utilized to explain discharge instructions, Patient verbalized understanding of dc instructions, vss, ambulatory with nad. Patient's family to pick her up upon discharge.

## 2020-02-21 NOTE — ED Provider Notes (Signed)
[facility] EMERGENCY DEPARTMENT Provider Note   CSN: [id number] Arrival date & time: 02/20/20  2133     History Chief Complaint  Patient presents with  . Palpitations  . Shortness of Breath  . Leg Pain    Tracy Petty is a 39 y.o. female.  The history is provided by the patient. The history is limited by a language barrier. A language interpreter was used.  Palpitations Palpitations quality:  Fast Onset quality:  Sudden Timing:  Constant Progression:  Unchanged Chronicity:  New Context: not anxiety, not appetite suppressants, not blood loss and not bronchodilators   Relieved by:  Nothing Worsened by:  Nothing Ineffective treatments:  None tried Associated symptoms: shortness of breath   Associated symptoms: no chest pain, no chest pressure, no cough, no diaphoresis, no dizziness, no hemoptysis, no lower extremity edema, no nausea, no near-syncope, no numbness, no orthopnea, no syncope, no vomiting and no weakness   Risk factors: no hx of PE   Shortness of Breath Associated symptoms: no chest pain, no cough, no diaphoresis, no hemoptysis, no rash, no syncope and no vomiting   Patient on new BP medication for the past 2 weeks does not feel like it is working.  BP still elevated and has palpitations and feels heart is racing.  She reports it is currently racing at the time evaluation though her HR on the monitor is 85.  She reports being seen 2 days ago and given medication to relax her (voltaren and flexeril).  Denies pain or trauma.       Past Medical History:  Diagnosis Date  . Allergy    spring  . High cholesterol     Patient Active Problem List   Diagnosis Date Noted  . Acute cholecystitis 07/18/2017    Past Surgical History:  Procedure Laterality Date  . CHOLECYSTECTOMY N/A 07/18/2017   Procedure: LAPAROSCOPIC CHOLECYSTECTOMY;  Surgeon: Kinsinger, De Blanch, MD;  Location: [facility] OR;  Service: General;  Laterality: N/A;  . NSVD      x1     OB History   No obstetric history on file.     Family History  Problem Relation Age of Onset  . Heart disease Neg Hx   . Colon cancer Neg Hx   . Diabetes Neg Hx   . Colon polyps Neg Hx   . Rectal cancer Neg Hx   . Stomach cancer Neg Hx     Social History   Tobacco Use  . Smoking status: Former Games developer  . Smokeless tobacco: Never Used  Vaping Use  . Vaping Use: Never used  Substance Use Topics  . Alcohol use: Yes    Alcohol/week: 0.0 standard drinks    Comment: occasionally   . Drug use: No    Home Medications Prior to Admission medications   Medication Sig Start Date End Date Taking? Authorizing Provider  albuterol (VENTOLIN HFA) 108 (90 Base) MCG/ACT inhaler Inhale 2 puffs into the lungs as needed for wheezing or shortness of breath.    [provider]  HYDROcodone-acetaminophen (NORCO/VICODIN) 5-325 MG tablet Take 1-2 tablets by mouth every 4 (four) hours as needed for moderate pain. Patient not taking: Reported on 12/19/2019 07/19/17   Jerre Simon, PA  meloxicam (MOBIC) 7.5 MG tablet Take 1 tablet (7.5 mg total) by mouth daily. Patient not taking: Reported on 05/25/2017 10/05/15   Trena Platt D, PA  polyethylene glycol powder (MIRALAX) powder Place 1 capfull of powder in 8 oz of  water or gatorade and consume daily. If you are unable to have a bowel movement, increase dosage to 2 capfulls daily. Do not use for more than 1 week without consulting your doctor. Patient not taking: Reported on 05/25/2017 08/01/16   Charlie Pitter, MD    Allergies    Patient has no known allergies.  Review of Systems   Review of Systems  Constitutional: Negative for diaphoresis.  HENT: Negative for congestion.   Eyes: Negative for visual disturbance.  Respiratory: Positive for shortness of breath. Negative for cough and hemoptysis.   Cardiovascular: Positive for palpitations. Negative for chest pain, orthopnea, leg swelling, syncope and near-syncope.    Gastrointestinal: Negative for nausea and vomiting.  Genitourinary: Negative for difficulty urinating.  Musculoskeletal: Negative for arthralgias.  Skin: Negative for rash.  Neurological: Negative for dizziness, facial asymmetry, speech difficulty, weakness and numbness.  Psychiatric/Behavioral: Negative for agitation.  All other systems reviewed and are negative.   Physical Exam Updated Vital Signs BP 124/86   Pulse 66   Temp 98 F (36.7 C) (Oral)   Resp 20   Ht 5' (1.524 m)   Wt 65 kg   SpO2 100%   BMI 27.99 kg/m   Physical Exam Vitals and nursing note reviewed.  Constitutional:      General: She is not in acute distress.    Appearance: Normal appearance.  HENT:     Head: Normocephalic and atraumatic.     Nose: Nose normal.  Eyes:     Conjunctiva/sclera: Conjunctivae normal.     Pupils: Pupils are equal, round, and reactive to light.  Cardiovascular:     Rate and Rhythm: Normal rate and regular rhythm.     Pulses: Normal pulses.     Heart sounds: Normal heart sounds.  Pulmonary:     Effort: Pulmonary effort is normal.     Breath sounds: Normal breath sounds.  Abdominal:     General: Abdomen is flat. Bowel sounds are normal.     Palpations: Abdomen is soft.     Tenderness: There is no abdominal tenderness. There is no guarding or rebound.  Musculoskeletal:        General: Normal range of motion.     Cervical back: Normal range of motion and neck supple.  Skin:    General: Skin is warm and dry.     Capillary Refill: Capillary refill takes less than 2 seconds.  Neurological:     General: No focal deficit present.     Mental Status: She is alert and oriented to person, place, and time.     Deep Tendon Reflexes: Reflexes normal.  Psychiatric:        Mood and Affect: Mood is anxious.     ED Results / Procedures / Treatments   Labs (all labs ordered are listed, but only abnormal results are displayed) Results for orders placed or performed during the  hospital encounter of 02/20/20  Basic metabolic panel  Result Value Ref Range   Sodium 136 135 - 145 mmol/L   Potassium 3.4 (L) 3.5 - 5.1 mmol/L   Chloride 104 98 - 111 mmol/L   CO2 21 (L) 22 - 32 mmol/L   Glucose, Bld 103 (H) 70 - 99 mg/dL   BUN 9 6 - 20 mg/dL   Creatinine, Ser 6.64 0.44 - 1.00 mg/dL   Calcium 8.8 (L) 8.9 - 10.3 mg/dL   GFR, Estimated >40 >34 mL/min   Anion gap 11 5 - 15  CBC  Result  Value Ref Range   WBC 9.0 4.0 - 10.5 K/uL   RBC 4.73 3.87 - 5.11 MIL/uL   Hemoglobin 10.9 (L) 12.0 - 15.0 g/dL   HCT 35.3 (L) 36 - 46 %   MCV 75.5 (L) 80.0 - 100.0 fL   MCH 23.0 (L) 26.0 - 34.0 pg   MCHC 30.5 30.0 - 36.0 g/dL   RDW 29.9 (H) 24.2 - 68.3 %   Platelets 360 150 - 400 K/uL   nRBC 0.0 0.0 - 0.2 %  I-Stat beta hCG blood, ED  Result Value Ref Range   I-stat hCG, quantitative <5.0 <5 mIU/mL   Comment 3          Troponin I (High Sensitivity)  Result Value Ref Range   Troponin I (High Sensitivity) <2 <18 ng/L   DG Chest 2 View  Result Date: 02/20/2020 CLINICAL DATA:  Generalized weakness, headache and shortness of breath. EXAM: CHEST - 2 VIEW COMPARISON:  December 18, 2019 FINDINGS: The heart size and mediastinal contours are within normal limits. Both lungs are clear. The visualized skeletal structures are unremarkable. IMPRESSION: No active cardiopulmonary disease. Electronically Signed   By: Aram Candela M.D.   On: 02/20/2020 22:17    EKG  EKG Interpretation  Date/Time:  Tuesday February 20 2020 21:59:33 EDT Ventricular Rate:  82 PR Interval:  152 QRS Duration: 128 QT Interval:  420 QTC Calculation: 490 R Axis:   80 Text Interpretation: Normal sinus rhythm Right bundle branch block Confirmed by Nicanor Alcon, Kemyra August ([id number]) on 02/21/2020 4:26:28 AM       Radiology DG Chest 2 View  Result Date: 02/20/2020 CLINICAL DATA:  Generalized weakness, headache and shortness of breath. EXAM: CHEST - 2 VIEW COMPARISON:  December 18, 2019 FINDINGS: The heart size and  mediastinal contours are within normal limits. Both lungs are clear. The visualized skeletal structures are unremarkable. IMPRESSION: No active cardiopulmonary disease. Electronically Signed   By: Aram Candela M.D.   On: 02/20/2020 22:17    Procedures Procedures (including critical care time)  Medications Ordered in ED Medications - No data to display  ED Course  I have reviewed the triage vital signs and the nursing notes.  Pertinent labs & imaging results that were available during my care of the patient were reviewed by me and considered in my medical decision making (see chart for details).   Ruled out for MI and PE in the ED.  HEART score is 1 very low risk for MACE.  BP is normal in the ED without intervention.  Follow up with your PMD for ongoing care.    Tracy Petty was evaluated in Emergency Department on 02/21/2020 for the symptoms described in the history of present illness. She was evaluated in the context of the global COVID-19 pandemic, which necessitated consideration that the patient might be at risk for infection with the SARS-CoV-2 virus that causes COVID-19. Institutional protocols and algorithms that pertain to the evaluation of patients at risk for COVID-19 are in a state of rapid change based on information released by regulatory bodies including the CDC and federal and state organizations. These policies and algorithms were followed during the patient's care in the ED.  Final Clinical Impression(s) / ED Diagnoses Return for intractable cough, coughing up blood,fevers >100.4 unrelieved by medication, shortness of breath, intractable vomiting, chest pain, shortness of breath, weakness,numbness, changes in speech, facial asymmetry,abdominal pain, passing out,Inability to tolerate liquids or food, cough, altered mental status or any concerns. No signs  of systemic illness or infection. The patient is nontoxic-appearing on exam and vital signs are within  normal limits.   I have reviewed the triage vital signs and the nursing notes. Pertinent labs &imaging results that were available during my care of the patient were reviewed by me and considered in my medical decision making (see chart for details).After history, exam, and medical workup I feel the patient has beenappropriately medically screened and is safe for discharge home. Pertinent diagnoses were discussed with the patient. Patient was given return precautions.      Prathik Aman, MD 02/21/20 [id number]

## 2020-02-21 NOTE — ED Notes (Signed)
Patient transported to CT 

## 2020-03-22 ENCOUNTER — Telehealth: Payer: Self-pay | Admitting: Radiology

## 2020-03-22 ENCOUNTER — Encounter: Payer: Self-pay | Admitting: Cardiology

## 2020-03-22 ENCOUNTER — Ambulatory Visit (INDEPENDENT_AMBULATORY_CARE_PROVIDER_SITE_OTHER): Payer: Self-pay | Admitting: Cardiology

## 2020-03-22 ENCOUNTER — Other Ambulatory Visit: Payer: Self-pay

## 2020-03-22 VITALS — BP 148/82 | HR 67 | Wt 156.8 lb

## 2020-03-22 DIAGNOSIS — Z7189 Other specified counseling: Secondary | ICD-10-CM

## 2020-03-22 DIAGNOSIS — R002 Palpitations: Secondary | ICD-10-CM

## 2020-03-22 DIAGNOSIS — R079 Chest pain, unspecified: Secondary | ICD-10-CM

## 2020-03-22 DIAGNOSIS — I10 Essential (primary) hypertension: Secondary | ICD-10-CM

## 2020-03-22 NOTE — Patient Instructions (Signed)
Medication Instructions:  Your Physician recommend you continue on your current medication as directed.    *If you need a refill on your cardiac medications before your next appointment, please call your pharmacy*   Lab Work: None   Testing/Procedures: Our physician has recommended that you wear an 7  DAY ZIO-PATCH monitor. The Zio patch cardiac monitor continuously records heart rhythm data for up to 14 days, this is for patients being evaluated for multiple types heart rhythms. For the first 24 hours post application, please avoid getting the Zio monitor wet in the shower or by excessive sweating during exercise. After that, feel free to carry on with regular activities. Keep soaps and lotions away from the ZIO XT Patch.   Someone from our office will call to verify address and mail monitor.     Follow-Up: At [facility], you and your health needs are our priority.  As part of our continuing mission to provide you with exceptional heart care, we have created designated Provider Care Teams.  These Care Teams include your primary Cardiologist (physician) and Advanced Practice Providers (APPs -  Physician Assistants and Nurse Practitioners) who all work together to provide you with the care you need, when you need it.  We recommend signing up for the patient portal called "MyChart".  Sign up information is provided on this After Visit Summary.  MyChart is used to connect with patients for Virtual Visits (Telemedicine).  Patients are able to view lab/test results, encounter notes, upcoming appointments, etc.  Non-urgent messages can be sent to your provider as well.   To learn more about what you can do with MyChart, go to [email or web address].    Your next appointment:   As needed  The format for your next appointment:   In Person  Provider:   Jodelle Red, MD  Christena Deem- Long Term Monitor Instructions   Your physician has requested you wear your ZIO patch  monitor_______days.   This is a single patch monitor.  [employer] supplies one patch monitor per enrollment.  Additional stickers are not available.   Please do not apply patch if you will be having a Nuclear Stress Test, Echocardiogram, Cardiac CT, MRI, or Chest Xray during the time frame you would be wearing the monitor. The patch cannot be worn during these tests.  You cannot remove and re-apply the ZIO XT patch monitor.   Your ZIO patch monitor will be sent USPS Priority mail from [facility] directly to your home address. The monitor may also be mailed to a PO BOX if home delivery is not available.   It may take 3-5 days to receive your monitor after you have been enrolled.   Once you have received you monitor, please review enclosed instructions.  Your monitor has already been registered assigning a specific monitor serial # to you.   Applying the monitor   Shave hair from upper left chest.   Hold abrader disc by orange tab.  Rub abrader in 40 strokes over left upper chest as indicated in your monitor instructions.   Clean area with 4 enclosed alcohol pads .  Use all pads to assure are is cleaned thoroughly.  Let dry.   Apply patch as indicated in monitor instructions.  Patch will be place under collarbone on left side of chest with arrow pointing upward.   Rub patch adhesive wings for 2 minutes.Remove white label marked "1".  Remove white label marked "2".  Rub patch adhesive wings for 2 additional minutes.  While looking in a mirror, press and release button in center of patch.  A small green light will flash 3-4 times .  This will be your only indicator the monitor has been turned on.     Do not shower for the first 24 hours.  You may shower after the first 24 hours.   Press button if you feel a symptom. You will hear a small click.  Record Date, Time and Symptom in the Patient Log Book.   When you are ready to remove patch, follow instructions on last 2 pages of Patient  Log Book.  Stick patch monitor onto last page of Patient Log Book.   Place Patient Log Book in Blue box.  Use locking tab on box and tape box closed securely.  The Orange and White box has prepaid postage on it.  Please place in mailbox as soon as possible.  Your physician should have your test results approximately 7 days after the monitor has been mailed back to [employer].   Call [employer] Customer Care at [phone] if you have questions regarding your ZIO XT patch monitor.  Call them immediately if you see an orange light blinking on your monitor.   If your monitor falls off in less than 4 days contact our Monitor department at [phone].  If your monitor becomes loose or falls off after 4 days call [employer] at [phone] for suggestions on securing your monitor.     

## 2020-03-22 NOTE — Telephone Encounter (Signed)
Enrolled patient for a 7 day Zio XT monitor to be mailed to patients home.  

## 2020-03-22 NOTE — Progress Notes (Signed)
Cardiology Office Note:    Date:  03/22/2020   ID:  Tracy Petty, DOB 1981-03-08, MRN [id number]  PCP:  Mechele Dawley, PA-C  Cardiologist:  Jodelle Red, MD  Referring MD: Mechele Dawley, PA-C   CC: new patient evaluation for palpitations and chest tightness.  History of Present Illness:    Tracy Petty is a 39 y.o. female with a hx of hypertension who is seen as a new consult at the request of Mechele Dawley, PA-C for the evaluation and management of palpitations and chest tightness..  I do not have available records. Per referral note, referred for evaluation of palpitations and chest tightness  ER note from Dr. Nicanor Alcon dated 02/21/20 reviewed. Seen for palpitations. Noted to be recently started on new blood pressure medication, which she does not feel is working. Noted to be having symptoms while on telemetry, in sinus rhythm at 85 bpm at the time of symptoms. Workup noted for microcytic anemia (Hgb 10.9, MCV 75.5), normal hsTnI. CTPE negative.  Today: Here with [facility] interpreter Ana Alvarez-Pevida.  No current concerns that she . Feels better than when she was in the ER. Last episode of chest pressure was about 8 days ago, lasted about an hour. Was working at the time. Felt like a mild central dullness, nonradiating. No associated symptoms. Nothing made it better or worse. Can't remember if she had eaten around that time.   Has occasional palpitations, feels more when she is lying down. Doesn't keep her from sleep, only when she lays on her side. Hard to quantify how often she is having symptoms.   No bleeding that she is aware of.  Denies shortness of breath at rest or with normal exertion. No PND, orthopnea, LE edema or unexpected weight gain. No syncope.  I do not have a copy of her lipids, but she reports being told her cholesterol was borderline in the past. Does have a primary care doctor that she follows with.  Past Medical History:   Diagnosis Date  . Allergy    spring  . High cholesterol     Past Surgical History:  Procedure Laterality Date  . CHOLECYSTECTOMY N/A 07/18/2017   Procedure: LAPAROSCOPIC CHOLECYSTECTOMY;  Surgeon: Kinsinger, De Blanch, MD;  Location: [facility] OR;  Service: General;  Laterality: N/A;  . NSVD     x1    Current Medications: Current Outpatient Medications on File Prior to Visit  Medication Sig  . lisinopril (ZESTRIL) 20 MG tablet Take 20 mg by mouth daily.   No current facility-administered medications on file prior to visit.     Allergies:   Patient has no known allergies.   Social History   Tobacco Use  . Smoking status: Former Games developer  . Smokeless tobacco: Never Used  Vaping Use  . Vaping Use: Never used  Substance Use Topics  . Alcohol use: Yes    Alcohol/week: 0.0 standard drinks    Comment: occasionally   . Drug use: No    Family History: family history is negative for Heart disease, Colon cancer, Diabetes, Colon polyps, Rectal cancer, and Stomach cancer.  ROS:   Please see the history of present illness.  Additional pertinent ROS: Constitutional: Negative for chills, fever, night sweats, unintentional weight loss  HENT: Negative for ear pain and hearing loss.   Eyes: Negative for loss of vision and eye pain.  Respiratory: Negative for cough, sputum, wheezing.   Cardiovascular: See HPI. Gastrointestinal: Negative for abdominal pain, melena, and hematochezia.  Genitourinary:  Negative for dysuria and hematuria.  Musculoskeletal: Negative for falls and myalgias.  Skin: Negative for itching and rash.  Neurological: Negative for focal weakness, focal sensory changes and loss of consciousness.  Endo/Heme/Allergies: Does not bruise/bleed easily.     EKGs/Labs/Other Studies Reviewed:    The following studies were reviewed today: No prior cardiac studies  EKG:  EKG is personally reviewed.  The ekg ordered today demonstrates sinus rhythm with sinus arrhythmia at 67  bpm  Recent Labs: 02/20/2020: BUN 9; Creatinine, Ser 0.58; Hemoglobin 10.9; Platelets 360; Potassium 3.4; Sodium 136  Recent Lipid Panel    Component Value Date/Time   CHOL 223 (H) 10/05/2015 1224   TRIG 131 10/05/2015 1224   HDL 69 10/05/2015 1224   CHOLHDL 3.2 10/05/2015 1224   VLDL 26 10/05/2015 1224   LDLCALC 128 10/05/2015 1224    Physical Exam:    VS:  BP (!) 148/82   Pulse 67   Wt 156 lb 12.8 oz (71.1 kg)   SpO2 99%   BMI 30.62 kg/m     Wt Readings from Last 3 Encounters:  03/22/20 156 lb 12.8 oz (71.1 kg)  02/20/20 143 lb 4.8 oz (65 kg)  12/18/19 163 lb 2.3 oz (74 kg)    GEN: Well nourished, well developed in no acute distress HEENT: Normal, moist mucous membranes NECK: No JVD CARDIAC: regular rhythm, normal S1 and S2, no rubs or gallops. No murmurs. VASCULAR: Radial and DP pulses 2+ bilaterally. No carotid bruits RESPIRATORY:  Clear to auscultation without rales, wheezing or rhonchi  ABDOMEN: Soft, non-tender, non-distended MUSCULOSKELETAL:  Ambulates independently SKIN: Warm and dry, no edema NEUROLOGIC:  Alert and oriented x 3. No focal neuro deficits noted. PSYCHIATRIC:  Normal affect    ASSESSMENT:    1. Palpitations   2. Essential hypertension   3. Chest pain of uncertain etiology   4. Cardiac risk counseling   5. Counseling on health promotion and disease prevention    PLAN:    Palpitations: -will order Zio monitor to evaluate for arrhythmia -echo if monitor abnormal  Hypertension: -elevated BP today -taking 20 mg lisinopril daily -followup with PCP  Chest pain -improved, none in 8 days -CTPE negative Cardiac risk counseling and prevention recommendations: -recommend heart healthy/Mediterranean diet, with whole grains, fruits, vegetable, fish, lean meats, nuts, and olive oil. Limit salt. -recommend moderate walking, 3-5 times/week for 30-50 minutes each session. Aim for at least 150 minutes.week. Goal should be pace of 3 miles/hours, or  walking 1.5 miles in 30 minutes -recommend avoidance of tobacco products. Avoid excess alcohol. -ASCVD risk score: The ASCVD Risk score Denman George DC Jr., et al., 2013) failed to calculate for the following reasons:   Cannot find a previous HDL lab   Cannot find a previous total cholesterol lab    Plan for follow up: if monitor is unremarkable, can follow up as needed  Jodelle Red, MD, PhD [place]  [facility]    Medication Adjustments/Labs and Tests Ordered: Current medicines are reviewed at length with the patient today.  Concerns regarding medicines are outlined above.  Orders Placed This Encounter  Procedures  . LONG TERM MONITOR (3-14 DAYS)  . EKG 12-Lead   No orders of the defined types were placed in this encounter.   Patient Instructions  Medication Instructions:  Your Physician recommend you continue on your current medication as directed.    *If you need a refill on your cardiac medications before your next appointment, please call your pharmacy*  Lab Work: None   Testing/Procedures: Our physician has recommended that you wear an 7  DAY ZIO-PATCH monitor. The Zio patch cardiac monitor continuously records heart rhythm data for up to 14 days, this is for patients being evaluated for multiple types heart rhythms. For the first 24 hours post application, please avoid getting the Zio monitor wet in the shower or by excessive sweating during exercise. After that, feel free to carry on with regular activities. Keep soaps and lotions away from the ZIO XT Patch.   Someone from our office will call to verify address and mail monitor.     Follow-Up: At [facility], you and your health needs are our priority.  As part of our continuing mission to provide you with exceptional heart care, we have created designated Provider Care Teams.  These Care Teams include your primary Cardiologist (physician) and Advanced Practice Providers (APPs -  Physician Assistants  and Nurse Practitioners) who all work together to provide you with the care you need, when you need it.  We recommend signing up for the patient portal called "MyChart".  Sign up information is provided on this After Visit Summary.  MyChart is used to connect with patients for Virtual Visits (Telemedicine).  Patients are able to view lab/test results, encounter notes, upcoming appointments, etc.  Non-urgent messages can be sent to your provider as well.   To learn more about what you can do with MyChart, go to [email or web address].    Your next appointment:   As needed  The format for your next appointment:   In Person  Provider:   Jodelle Red, MD  Christena Deem- Long Term Monitor Instructions   Your physician has requested you wear your ZIO patch monitor_______days.   This is a single patch monitor.  [employer] supplies one patch monitor per enrollment.  Additional stickers are not available.   Please do not apply patch if you will be having a Nuclear Stress Test, Echocardiogram, Cardiac CT, MRI, or Chest Xray during the time frame you would be wearing the monitor. The patch cannot be worn during these tests.  You cannot remove and re-apply the ZIO XT patch monitor.   Your ZIO patch monitor will be sent USPS Priority mail from [facility] directly to your home address. The monitor may also be mailed to a PO BOX if home delivery is not available.   It may take 3-5 days to receive your monitor after you have been enrolled.   Once you have received you monitor, please review enclosed instructions.  Your monitor has already been registered assigning a specific monitor serial # to you.   Applying the monitor   Shave hair from upper left chest.   Hold abrader disc by orange tab.  Rub abrader in 40 strokes over left upper chest as indicated in your monitor instructions.   Clean area with 4 enclosed alcohol pads .  Use all pads to assure are is cleaned thoroughly.  Let dry.    Apply patch as indicated in monitor instructions.  Patch will be place under collarbone on left side of chest with arrow pointing upward.   Rub patch adhesive wings for 2 minutes.Remove white label marked "1".  Remove white label marked "2".  Rub patch adhesive wings for 2 additional minutes.   While looking in a mirror, press and release button in center of patch.  A small green light will flash 3-4 times .  This will be your only indicator the monitor has been  turned on.     Do not shower for the first 24 hours.  You may shower after the first 24 hours.   Press button if you feel a symptom. You will hear a small click.  Record Date, Time and Symptom in the Patient Log Book.   When you are ready to remove patch, follow instructions on last 2 pages of Patient Log Book.  Stick patch monitor onto last page of Patient Log Book.   Place Patient Log Book in [place] box.  Use locking tab on box and tape box closed securely.  The Orange [employer] box has [employer] Cop[employer]aid postage on it.  Please place in mailbox as soon as possible.  Your physician should have your test results approximately 7 days after the monitor has been [facility] to [facility].   Call [facility] Customer Care at [phone] if you have questions regarding your ZIO XT patch monitor.  Call them immediately if you see an orange light blinking on your monitor.   If your monitor falls off in less than 4 days contact our Monitor department at [phone].  If your monitor becomes loose or falls off after 4 days call [employer] at [phone] for suggestions on securing your monitor.      Signed, Jodelle RedBridgette Verdean Murin, MD PhD 03/22/2020    [facility]

## 2020-07-07 ENCOUNTER — Encounter: Payer: Self-pay | Admitting: Cardiology

## 2020-08-15 NOTE — Telephone Encounter (Signed)
Monitor was never returned and marked "lost" in the Zio system. Order will be cancelled 

## 2020-12-23 ENCOUNTER — Encounter (HOSPITAL_COMMUNITY): Payer: Self-pay | Admitting: *Deleted

## 2020-12-23 ENCOUNTER — Emergency Department (HOSPITAL_COMMUNITY)
Admission: EM | Admit: 2020-12-23 | Discharge: 2020-12-23 | Disposition: A | Discharge status: Home / Self Care | Payer: Self-pay | Attending: Emergency Medicine | Admitting: Emergency Medicine

## 2020-12-23 ENCOUNTER — Other Ambulatory Visit: Payer: Self-pay

## 2020-12-23 DIAGNOSIS — I10 Essential (primary) hypertension: Secondary | ICD-10-CM | POA: Insufficient documentation

## 2020-12-23 DIAGNOSIS — Z79899 Other long term (current) drug therapy: Secondary | ICD-10-CM | POA: Insufficient documentation

## 2020-12-23 DIAGNOSIS — K644 Residual hemorrhoidal skin tags: Secondary | ICD-10-CM | POA: Insufficient documentation

## 2020-12-23 DIAGNOSIS — Z87891 Personal history of nicotine dependence: Secondary | ICD-10-CM | POA: Insufficient documentation

## 2020-12-23 HISTORY — DX: Essential (primary) hypertension: I10

## 2020-12-23 MED ORDER — HYDROCORTISONE (PERIANAL) 2.5 % EX CREA: 1.0000 "application " | TOPICAL_CREAM | Freq: Two times a day (BID) | CUTANEOUS | 0 refills | Status: DC

## 2020-12-23 NOTE — ED Triage Notes (Signed)
Pt states rectal pain that increases with bowel movements - since Friday.  Denies rectal bleeding.

## 2020-12-23 NOTE — ED Provider Notes (Signed)
[facility] EMERGENCY DEPARTMENT Provider Note   CSN: [id number] Arrival date & time: 12/23/20  2585     History Chief Complaint  Patient presents with   Rectal Pain    Tracy Petty is a 40 y.o. female.  Patient to ED for evaluation of rectal pain for the past several days. No bleeding, abdominal pain. She does not feel she is constipated. No fever. No history of similar symptoms. She denies anal intercourse.   The history is provided by the patient. A language interpreter was used.      Past Medical History:  Diagnosis Date   Allergy    spring   High cholesterol    Hypertension     Patient Active Problem List   Diagnosis Date Noted   Acute cholecystitis 07/18/2017    Past Surgical History:  Procedure Laterality Date   CHOLECYSTECTOMY N/A 07/18/2017   Procedure: LAPAROSCOPIC CHOLECYSTECTOMY;  Surgeon: Kinsinger, De Blanch, MD;  Location: [facility] OR;  Service: General;  Laterality: N/A;   NSVD     x1     OB History   No obstetric history on file.     Family History  Problem Relation Age of Onset   Heart disease Neg Hx    Colon cancer Neg Hx    Diabetes Neg Hx    Colon polyps Neg Hx    Rectal cancer Neg Hx    Stomach cancer Neg Hx     Social History   Tobacco Use   Smoking status: Former   Smokeless tobacco: Never  Vaping Use   Vaping Use: Never used  Substance Use Topics   Alcohol use: Yes    Alcohol/week: 0.0 standard drinks    Comment: occasionally    Drug use: No    Home Medications Prior to Admission medications   Medication Sig Start Date End Date Taking? Authorizing Provider  lisinopril (ZESTRIL) 20 MG tablet Take 20 mg by mouth daily.    [provider]    Allergies    Patient has no known allergies.  Review of Systems   Review of Systems  Constitutional:  Negative for fever.  Gastrointestinal:  Positive for rectal pain. Negative for abdominal pain, anal bleeding and nausea.  Genitourinary:   Negative for pelvic pain.  Musculoskeletal:  Negative for back pain.   Physical Exam Updated Vital Signs BP (!) 141/83 (BP Location: Left Arm)   Pulse 89   Temp 98.1 F (36.7 C) (Oral)   Resp 18   Ht 5' (1.524 m)   Wt 70.8 kg   LMP 12/12/2020   SpO2 100%   BMI 30.47 kg/m   Physical Exam Constitutional:      General: She is not in acute distress.    Appearance: She is well-developed. She is not ill-appearing.  Pulmonary:     Effort: Pulmonary effort is normal.  Genitourinary:    Comments: Single, large, non-thrombosed external hemorrhoid. No bleeding. Musculoskeletal:        General: Normal range of motion.     Cervical back: Normal range of motion.  Skin:    General: Skin is warm and dry.  Neurological:     Mental Status: She is alert and oriented to person, place, and time.    ED Results / Procedures / Treatments   Labs (all labs ordered are listed, but only abnormal results are displayed) Labs Reviewed - No data to display  EKG None  Radiology No results found.  Procedures Procedures  Medications Ordered in ED Medications - No data to display  ED Course  I have reviewed the triage vital signs and the nursing notes.  Pertinent labs & imaging results that were available during my care of the patient were reviewed by me and considered in my medical decision making (see chart for details).    MDM Rules/Calculators/A&P                           Patient to ED with rectal pain. Exam c/w non-thrombosed external hemorrhoid. Anusol HC, Colace, Sitz baths, PCP follow up recommended.   Final Clinical Impression(s) / ED Diagnoses Final diagnoses:  None   External hemorrhoid  Rx / DC Orders ED Discharge Orders     None        Elpidio Anis, PA-C 12/23/20 [id number]    Geoffery Lyons, MD 12/23/20 [phone]

## 2020-12-23 NOTE — Discharge Instructions (Addendum)
Utilice la Crema Anusol [facility] dos veces al da. Tambin recomiende [employer], que es un ablandador de heces y Statistician la evacuacin intestinal. (Use the Anusol HC Cream twice a day. Also recommend using Colace which is a stool softener and will make having a bowel movement easier.)  Tome baos de asiento para un alivio adicional. (Take Sitz baths for extra relief.)

## 2022-03-24 ENCOUNTER — Encounter: Payer: Self-pay | Admitting: Nurse Practitioner

## 2022-05-05 ENCOUNTER — Other Ambulatory Visit (INDEPENDENT_AMBULATORY_CARE_PROVIDER_SITE_OTHER): Payer: Self-pay

## 2022-05-05 ENCOUNTER — Ambulatory Visit (INDEPENDENT_AMBULATORY_CARE_PROVIDER_SITE_OTHER): Payer: Self-pay | Admitting: Nurse Practitioner

## 2022-05-05 ENCOUNTER — Encounter: Payer: Self-pay | Admitting: Nurse Practitioner

## 2022-05-05 VITALS — BP 128/70 | HR 90 | Ht 60.0 in | Wt 171.0 lb

## 2022-05-05 DIAGNOSIS — K219 Gastro-esophageal reflux disease without esophagitis: Secondary | ICD-10-CM

## 2022-05-05 DIAGNOSIS — R1010 Upper abdominal pain, unspecified: Secondary | ICD-10-CM

## 2022-05-05 DIAGNOSIS — D509 Iron deficiency anemia, unspecified: Secondary | ICD-10-CM

## 2022-05-05 LAB — CBC
HCT: 39.8 % (ref 36.0–46.0)
Hemoglobin: 13.5 g/dL (ref 12.0–15.0)
MCHC: 34 g/dL (ref 30.0–36.0)
MCV: 81.8 fl (ref 78.0–100.0)
Platelets: 343 10*3/uL (ref 150.0–400.0)
RBC: 4.86 Mil/uL (ref 3.87–5.11)
RDW: 18.9 % — ABNORMAL HIGH (ref 11.5–15.5)
WBC: 9.8 10*3/uL (ref 4.0–10.5)

## 2022-05-05 LAB — FOLATE: Folate: 20 ng/mL (ref >5.9)

## 2022-05-05 LAB — IBC + FERRITIN
Ferritin: 10.2 ng/mL (ref 10.0–291.0)
Iron: 126 ug/dL (ref 42–145)
Saturation Ratios: 22.3 % (ref 20.0–50.0)
TIBC: 565.6 ug/dL — ABNORMAL HIGH (ref 250.0–450.0)
Transferrin: 404 mg/dL — ABNORMAL HIGH (ref 212.0–360.0)

## 2022-05-05 LAB — VITAMIN B12: Vitamin B-12: 311 pg/mL (ref 211–911)

## 2022-05-05 MED ORDER — NA SULFATE-K SULFATE-MG SULF 17.5-3.13-1.6 GM/177ML PO SOLN: 1.0000 | Freq: Once | ORAL | 0 refills | Status: AC

## 2022-05-05 NOTE — Patient Instructions (Addendum)
Su proveedor le ha solicitado que vaya al stano para Optometrist anlisis de laboratorio antes de irse hoy. Presione "B" en el ascensor. El laboratorio est ubicado en la primera puerta a la izquierda al salir del Materials engineer.  Le hemos dado el presupuesto de su procedimiento programado.   You have been scheduled for an endoscopy and colonoscopy. Please follow the written instructions given to you at your visit today. Please pick up your prep supplies at the pharmacy within the next 1-3 days. If you use inhalers (even only as needed), please bring them with you on the day of your procedure.   Mantenga su plancha 5 das antes de su procedimiento.  If you are age 43 or younger, your body mass index should be between 19-25. Your Body mass index is 33.4 kg/m. If this is out of the aformentioned range listed, please consider follow up with your Primary Care Provider.   __________________________________________________________  The [facility] providers would like to encourage you to use [facility] to communicate with providers for non-urgent requests or questions.  Due to long hold times on the telephone, sending your provider a message by [facility] may be a faster and more efficient way to get a response.  Please allow 48 business hours for a response.  Please remember that this is for non-urgent requests.   Thank you for choosing me and [facility].  Tye Savoy, NP.  Enfermedad de reflujo gastroesofgico en los adultos Gastroesophageal Reflux Disease, Adult  El reflujo gastroesofgico (RGE) ocurre cuando el cido del estmago sube por el tubo que conecta la boca con el estmago (esfago). Normalmente, la comida baja por el esfago y se mantiene en el estmago, donde se la digiere. Cuando una persona tiene RGE, los alimentos y el cido estomacal suelen volver al esfago. Usted puede tener una enfermedad llamada enfermedad de reflujo gastroesofgico (ERGE) si el reflujo: Sucede a  menudo. Causa sntomas frecuentes o muy intensos. Causa problemas tales como dao en el esfago. Cuando esto ocurre, el esfago duele y se hincha. Con el tiempo, la ERGE puede ocasionar pequeos agujeros (lceras) en el revestimiento del esfago. Cules son las causas? Esta afeccin se debe a un problema en el msculo que se encuentra entre el esfago y [place]. Cuando este msculo est dbil o no es normal, no se cierra correctamente para impedir que los alimentos y el cido regresen del Paramedic. El msculo puede debilitarse debido a lo siguiente: El consumo de [place]. [place]. Tener cierto tipo de hernia (hernia de hiato). Consumo de alcohol. Ciertos alimentos y bebidas, como caf, chocolate, cebollas y [place]. Qu incrementa el riesgo? Tener sobrepeso. Tener una enfermedad que afecta el tejido conjuntivo. Tomar antiinflamatorios no esteroideos (AINE), como el ibuprofeno. Cules son los signos o sntomas? Acidez estomacal. Dificultad o dolor al tragar. Sensacin de Best boy un bulto en la garganta. Sabor amargo en la boca. Mal aliento. Tener una gran cantidad de saliva. Estmago inflamado o con Tree surgeon. Eructos. Dolor en el pecho. El dolor de pecho puede deberse a distintas afecciones. Asegrese de Teacher, adult education a su mdico si tiene Tourist information centre manager. Dificultad para respirar o sibilancias. Ardelia Mems tos a largo plazo o tos nocturna. Desgaste de la superficie de los dientes (esmalte dental). Prdida de peso. Cmo se trata? Realizar cambios en la dieta. Tomar medicamentos. Someterse a [place]. El tratamiento depender de la gravedad de los sntomas. Siga estas instrucciones en su casa: Comida y bebida  Siga una dieta como se lo haya indicado el mdico.  Es posible que deba evitar alimentos y bebidas, por ejemplo: Caf y t negro, con o sin cafena. Bebidas que contengan alcohol. Bebidas energticas y deportivas. Bebidas gaseosas (carbonatadas) y refrescos. Chocolate y  cacao. Menta y [place]. Ajo y cebolla. Rbano picante. Alimentos cidos y condimentados. Estos incluyen todos los tipos de pimientos, [place] en polvo, curry en polvo, vinagre, salsas picantes y [employer]. Ctricos y sus jugos, por ejemplo, naranjas, limones y limas. Alimentos que [employer]. Estos incluyen salsa roja, [place], salsa picante y pizza con salsa de [place]. Alimentos fritos y Radio broadcast assistant. Estos incluyen donas, papas fritas, papitas fritas de bolsa y aderezos con alto contenido de [place]. Carnes con alto contenido de [place]. Estas incluye los perros calientes, chuletas o costillas, embutidos, jamn y tocino. Productos lcteos ricos en grasas, como leche Commodore, [place] y [place] crema. Consuma pequeas cantidades de comida con ms frecuencia. Evite consumir porciones abundantes. Evite beber grandes cantidades de lquidos con las comidas. Evite comer 2 o 3 horas antes de acostarse. Evite recostarse inmediatamente despus de comer. No haga ejercicios enseguida despus de comer. Estilo de vida  No fume ni consuma ningn producto que contenga nicotina o tabaco. Si necesita ayuda para dejar de consumir estos productos, consulte al mdico. Intente reducir el nivel de estrs. Si necesita ayuda para hacer esto, consulte al mdico. Si tiene sobrepeso, baje una cantidad de peso saludable para usted. Consulte a su mdico para bajar de peso de [employer]. Instrucciones generales Est atento a cualquier cambio en los sntomas. Tome los medicamentos de venta libre y los recetados solamente como se lo haya indicado el mdico. No tome aspirina, ibuprofeno ni otros antiinflamatorios no esteroideos (AINE) a menos que el mdico lo autorice. Use ropa holgada. No use nada apretado alrededor de la cintura. Levante (eleve) la cabecera de la cama aproximadamente 6 pulgadas (15 cm). Para hacerlo, es posible que tenga que utilizar una cua. Evite inclinarse si al hacerlo empeoran los  sntomas. Cumpla con todas las visitas de seguimiento. Comunquese con un mdico si: Aparecen nuevos sntomas. Adelgaza y no sabe por qu. Tiene problemas para tragar o le duele cuando traga. Tiene sibilancias o tos persistente. Tiene la voz ronca. Los sntomas no mejoran con Dispensing optician. Solicite ayuda de inmediato si: Tree surgeon repentino [employer], el cuello, la [place], los dientes o la espalda. De repente se siente transpirado, mareado o aturdido. Siente falta de aire o Tourist information centre manager. Vomita y el vmito es de color verde, amarillo o negro, o tiene un aspecto similar a la sangre o a los posos de caf. Se desmaya. Las heces (deposiciones) son rojas, sanguinolentas o negras. No puede tragar, beber o comer. Estos sntomas pueden representar un problema grave que constituye Engineer, maintenance (IT). No espere a ver si los sntomas desaparecen. Solicite atencin mdica de inmediato. Comunquese con el servicio de emergencias de su localidad (911 en los [place]). No conduzca por sus propios medios Principal Financial. Resumen Si una persona tiene enfermedad de reflujo gastroesofgico (ERGE), los alimentos y el cido estomacal suben al esfago y causan sntomas o problemas tales como dao en el esfago. El tratamiento depender de la gravedad de los sntomas. Siga una dieta como se lo haya indicado el mdico. Use todos los medicamentos solamente como se lo haya indicado el mdico. Esta informacin no tiene Marine scientist el consejo del mdico. Asegrese de hacerle al mdico cualquier pregunta que tenga. Document Revised: 12/01/2019 Document Reviewed: 12/01/2019 Elsevier Patient Education  [place].

## 2022-05-05 NOTE — Progress Notes (Signed)
Assessment    Patient profile:  Tracy Petty is a 42 y.o. year old female , new to the practice with a past medical history of hypertension, hyperlipidemia and GERD.  See PMH / [place] for additional history. Referred by PCP for new iron deficiency anemia.    # Iron deficiency anemia (per PCP). No recent labs available for review. Started on oral iron in October after labs revealed iron deficiency anemia. No overt GI bleeding.  Addendum.  Received labs from PCP.  Labs from 02/11/2022 show a ferritin of 70, TIBC of 521, iron saturation 9%, hemoglobin 11.4, platelets 363.  Comprehensive metabolic profile normal.  Iron was prescribed mid October.  Labs reviewed from yesterday.  She has had a nice response to oral iron.  # Upper abdominal discomfort / pyrosis partially relieved with PPI. Gastritis? PUD?   Plan:    Obtain CBC, Ferritin, TIBC, B12, folate.  Will try and more recent labs from PCP Schedule for EGD and colonoscopy to evaluate anemia.  The risks and benefits of EGD and colonoscopy with possible polypectomy / biopsies were discussed and the patient agrees to proceed.  Hold iron for 5 days prior to procedures.  Continue Pantoprazole 40 mg before breakfast.  Anti-reflux measures discussed and will be provided in Spanish in AVS  Addendum: patient is self-pay. She wants to review projected costs of procedures prior to scheduling.    HPI:    Chief Complaint:  GERD symptoms. Referred by PCP for iron deficiency anemia. Here with a Spanish interpreter.   Labs not available but apparently iron deficient which is new.  She has been having postprandial heartburn and upper abdominal discomfort despite PPI started two months ago. I don't have labs drawn by PCP.  PPI has helped symptoms a little bit.  No nausea / vomiting. Weight is stable. She has had a cholecystectomy.   No NSAID use.  No overt GI bleeding. Stools black since starting oral iron. She has monthly menstrual  cycles. Bleeding is not heavy.   Reports "normal" BMs. No blood in stool. No [place] of gastric or stomach cancer.   Past Medical History:  Diagnosis Date   Allergy    spring   High cholesterol    Hypertension    Past Surgical History:  Procedure Laterality Date   CHOLECYSTECTOMY N/A 07/18/2017   Procedure: LAPAROSCOPIC CHOLECYSTECTOMY;  Surgeon: Kinsinger, Arta Bruce, MD;  Location: [place];  Service: General;  Laterality: N/A;   NSVD     x1   Family History  Problem Relation Age of Onset   Heart disease Neg Hx    Colon cancer Neg Hx    Diabetes Neg Hx    Colon polyps Neg Hx    Rectal cancer Neg Hx    Stomach cancer Neg Hx    Social History   Tobacco Use   Smoking status: Former   Smokeless tobacco: Never  Vaping Use   Vaping Use: Never used  Substance Use Topics   Alcohol use: Yes    Alcohol/week: 0.0 standard drinks of alcohol    Comment: occasionally    Drug use: No   Current Outpatient Medications  Medication Sig Dispense Refill   ferrous sulfate 325 (65 FE) MG tablet Take 325 mg by mouth daily with breakfast.     lisinopril (ZESTRIL) 20 MG tablet Take 20 mg by mouth daily.     pantoprazole (PROTONIX) 40 MG tablet Take 40 mg by mouth daily.  No current facility-administered medications for this visit.   No Known Allergies   Review of Systems: All systems reviewed and negative except where noted in HPI.   Wt Readings from Last 3 Encounters:  05/05/22 171 lb (77.6 kg)  12/23/20 156 lb (70.8 kg)  03/22/20 156 lb 12.8 oz (71.1 kg)    Physical Exam   BP 128/70   Pulse 90   Ht 5' (1.524 m)   Wt 171 lb (77.6 kg)   LMP 04/10/2022   BMI 33.40 kg/m  Constitutional:  Generally well appearing female in no acute distress. Psychiatric: Pleasant. Normal mood and affect. Behavior is normal. EENT: Pupils normal.  Conjunctivae are normal. No scleral icterus. Neck supple.  Cardiovascular: Normal rate, regular rhythm.  Pulmonary/chest: Effort normal and breath  sounds normal. No wheezing, rales or rhonchi. Abdominal: Soft, nondistended, nontender. Bowel sounds active throughout. There are no masses palpable. No hepatomegaly. Neurological: Alert and oriented to person place and time. Extremities: No edema Skin: Skin is warm and dry. No rashes noted.  Tye Savoy, NP  05/05/2022, 9:49 AM  Cc:  Referring Provider Velda Shell, PA-C

## 2022-05-06 ENCOUNTER — Telehealth: Payer: Self-pay

## 2022-05-06 NOTE — Telephone Encounter (Signed)
-----  Message from Willia Craze, NP sent at 05/05/2022  5:11 PM EST ----- Mickel Baas will you please call this patient's PCP and try and get a copy of her recent CBC, iron studies.  She was referred for iron deficiency anemia.  Though her ferritin is low, her hemoglobin is normal right now.  I would like to have labs to compare.  Also if her PCP did any other labs like a c-Met that would be great to.  Thanks

## 2022-05-06 NOTE — Telephone Encounter (Signed)
Requested most recent lab work from patient's PCP. PCP office stated that the patient's most recent lab work was on October 10th. Office stated that would fax labs to [phone].

## 2022-05-06 NOTE — Progress Notes (Signed)
Attending Physician's Attestation   I have reviewed the chart.   I agree with the Advanced Practitioner's note, impression, and recommendations with any updates as below. Endoscopic evaluation makes sense based on her symptoms.  Unfortunately finances may be problematic, but hopefully she will be able to get endoscopic evaluation soon.   Justice Britain, MD [facility] Advanced Endoscopy Office # [phone]

## 2022-06-05 ENCOUNTER — Ambulatory Visit (AMBULATORY_SURGERY_CENTER): Payer: Self-pay | Admitting: Gastroenterology

## 2022-06-05 ENCOUNTER — Encounter: Payer: Self-pay | Admitting: Gastroenterology

## 2022-06-05 ENCOUNTER — Encounter: Payer: Self-pay | Admitting: Nurse Practitioner

## 2022-06-05 VITALS — BP 103/70 | HR 66 | Temp 98.7°F | Resp 15 | Ht 60.0 in | Wt 171.0 lb

## 2022-06-05 DIAGNOSIS — K219 Gastro-esophageal reflux disease without esophagitis: Secondary | ICD-10-CM

## 2022-06-05 DIAGNOSIS — D509 Iron deficiency anemia, unspecified: Secondary | ICD-10-CM

## 2022-06-05 DIAGNOSIS — K3189 Other diseases of stomach and duodenum: Secondary | ICD-10-CM

## 2022-06-05 DIAGNOSIS — R1010 Upper abdominal pain, unspecified: Secondary | ICD-10-CM

## 2022-06-05 DIAGNOSIS — K31A0 Gastric intestinal metaplasia, unspecified: Secondary | ICD-10-CM

## 2022-06-05 DIAGNOSIS — K2950 Unspecified chronic gastritis without bleeding: Secondary | ICD-10-CM

## 2022-06-05 MED ORDER — SODIUM CHLORIDE 0.9 % IV SOLN: 500.0000 mL | Freq: Once | INTRAVENOUS | Status: DC

## 2022-06-05 NOTE — Patient Instructions (Signed)
Interpreter used today at the [facility] for this pt.  Interpreter's name is Judeth Porch.Post-op Bravo pH instructions Once you get home:  Resume all of your previous medications.  Read your handouts given to you by your recovery room nurse.  Use fiber con 1-2 daily.    USTED TUVO UN PROCEDIMIENTO ENDOSCPICO HOY EN EL [facility]:   Lea el informe del procedimiento que se le entreg para cualquier pregunta especfica sobre lo que se Primary school teacher.  Si el informe del examen no responde a sus preguntas, por favor llame a su gastroenterlogo para aclararlo.  Si usted solicit que no se le den J[employer] Circuit de lo que se Estate manager/land agent en su procedimiento al [employer] va a cuidar, entonces el informe del procedimiento se ha incluido en un sobre sellado para que usted lo revise despus cuando le sea ms conveniente.   LO QUE PUEDE ESPERAR: Algunas sensaciones de hinchazn en el abdomen.  Puede tener ms gases de lo normal.  El caminar puede ayudarle a eliminar el aire que se le puso en el tracto gastrointestinal durante el procedimiento y reducir la hinchazn.  Si le hicieron una endoscopia inferior (como una colonoscopia o una sigmoidoscopia flexible), podra notar manchas de sangre en las heces fecales o en el papel higinico.  Si se someti a una preparacin intestinal para su procedimiento, es posible que no tenga una evacuacin intestinal normal durante [employer].   Tenga en cuenta:  Es posible que note un poco de irritacin y congestin en la nariz o algn drenaje.  Esto es debido al oxgeno [employer] durante su procedimiento.  No hay que preocuparse y esto debe desaparecer ms o Scientist, research (medical).   SNTOMAS PARA REPORTAR INMEDIATAMENTE:  Despus de una endoscopia inferior (colonoscopia o sigmoidoscopia flexible):  Cantidades excesivas de sangre en las heces fecales  Sensibilidad significativa o empeoramiento de los dolores abdominales   Hinchazn aguda del  abdomen que antes no tena   Fiebre de 100F o ms   Despus de la endoscopia superior (EGD)  Vmitos de Biochemist, clinical o material como caf molido   Dolor en el pecho o dolor debajo de los omplatos que antes no tena   Dolor o dificultad persistente para tragar  Falta de aire que antes no tena   Fiebre de 100F o ms  Heces fecales negras y pegajosas   Para asuntos urgentes o de Freight forwarder, puede comunicarse con un gastroenterlogo a cualquier hora llamando al [phone].  DIETA:  Recomendamos una comida pequea al principio, pero luego puede continuar con su dieta normal.  Tome muchos lquidos, Teacher, adult education las bebidas alcohlicas durante 24 horas.    ACTIVIDAD:  Debe planear tomarse las cosas con calma por el resto del da y no debe CONDUCIR ni usar maquinaria pesada Programmer, applications (debido a los medicamentos de sedacin utilizados durante el examen).     SEGUIMIENTO: Nuestro personal llamar al nmero que aparece en su historial al siguiente da hbil de su procedimiento para ver cmo se siente y para responder cualquier pregunta o inquietud que pueda tener con respecto a la informacin que se le dio despus del procedimiento. Si no podemos contactarle, le dejaremos un mensaje.  Sin embargo, si se siente bien y no tiene Paediatric nurse, no es necesario que nos devuelva la llamada.  Asumiremos que ha regresado a sus actividades diarias normales sin incidentes. Si se le tomaron algunas biopsias, le contactaremos por telfono o por carta en las  prximas 3 semanas.  Si no ha sabido [employer] biopsias en el transcurso de 3 semanas, por favor llmenos al [phone].   FIRMAS/CONFIDENCIALIDAD: Usted y/o el acompaante que le cuide han firmado documentos que se ingresarn en su historial mdico electrnico.  Estas firmas atestiguan el hecho de que la informacin anterior

## 2022-06-05 NOTE — Progress Notes (Signed)
Called to room to assist during endoscopic procedure.  Patient ID and intended procedure confirmed with present staff. Received instructions for my participation in the procedure from the performing physician.  

## 2022-06-05 NOTE — Op Note (Signed)
Tracy Petty Patient Name: Tracy Petty Procedure Date: 06/05/2022 3:18 PM MRN: [id number] Endoscopist: Justice Britain , MD, [id number] Age: 42 Referring MD:  Date of Birth: 1980-11-09 Gender: Female Account #: [id number] Procedure:                Upper GI endoscopy Indications:              Iron deficiency anemia, Indigestion Medicines:                Monitored Anesthesia Care Procedure:                Pre-Anesthesia Assessment:                           - Prior to the procedure, a History and Physical                            was performed, and patient medications and                            allergies were reviewed. The patient's tolerance of                            previous anesthesia was also reviewed. The risks                            and benefits of the procedure and the sedation                            options and risks were discussed with the patient.                            All questions were answered, and informed consent                            was obtained. Prior Anticoagulants: The patient has                            taken no anticoagulant or antiplatelet agents. ASA                            Grade Assessment: II - A patient with mild systemic                            disease. After reviewing the risks and benefits,                            the patient was deemed in satisfactory condition to                            undergo the procedure.                           After obtaining informed consent, the endoscope was  passed under direct vision. Throughout the                            procedure, the patient's blood pressure, pulse, and                            oxygen saturations were monitored continuously. The                            Olympus scope [id number] was introduced through the                            mouth, and advanced to the second part of duodenum.                            The  upper GI endoscopy was accomplished without                            difficulty. The patient tolerated the procedure. Scope In: Scope Out: Findings:                 No gross lesions were noted in the entire esophagus.                           The Z-line was irregular and was found 38 cm from                            the incisors.                           A single 9 mm subepithelial papule (nodule) with no                            bleeding and no stigmata of recent bleeding was                            found in the gastric antrum. Tunneled biopsies were                            taken with a cold forceps for histology.                           Patchy mildly erythematous mucosa without bleeding                            was found in the entire examined stomach. Biopsies                            were taken with a cold forceps for histology and                            Helicobacter pylori testing.  No gross lesions were noted in the duodenal bulb,                            in the first portion of the duodenum and in the                            second portion of the duodenum. Biopsies were taken                            with a cold forceps for histology. Complications:            No immediate complications. Estimated Blood Loss:     Estimated blood loss was minimal. Impression:               - No gross lesions in the entire esophagus.                           - Z-line irregular, 38 cm from the incisors.                           - A single subepithelial papule (nodule) found in                            the antrum of the stomach. Tunnel biopsied.                           - Erythematous mucosa in the stomach. Biopsied.                           - No gross lesions in the duodenal bulb, in the                            first portion of the duodenum and in the second                            portion of the duodenum. Biopsied. Recommendation:            - Proceed to scheduled colonoscopy.                           - Continue present medications.                           - Await pathology results.                           - Consideration of EUS based on final pathology if                            we do not get a diagnosis based on tunneled                            biopsies.                           -  The findings and recommendations were discussed                            with the patient.                           - The findings and recommendations were discussed                            with the patient's family. Justice Britain, MD 06/05/2022 3:57:46 PM

## 2022-06-05 NOTE — Progress Notes (Signed)
GASTROENTEROLOGY PROCEDURE H&P NOTE   Primary Care Physician: Stana Bunting, PA-C  HPI: Tracy Petty is a 42 y.o. female who presents for EGD/Colonoscopy for evaluation of IDA and GERD.  Past Medical History:  Diagnosis Date   Allergy    spring   H. pylori infection    High cholesterol    Hypertension    Hypertriglyceridemia    Past Surgical History:  Procedure Laterality Date   CHOLECYSTECTOMY N/A 07/18/2017   Procedure: LAPAROSCOPIC CHOLECYSTECTOMY;  Surgeon: Kinsinger, Arta Bruce, MD;  Location: [place];  Service: General;  Laterality: N/A;   NSVD     x1   Current Outpatient Medications  Medication Sig Dispense Refill   ferrous sulfate 325 (65 FE) MG tablet Take 325 mg by mouth daily with breakfast.     lisinopril (ZESTRIL) 20 MG tablet Take 20 mg by mouth daily.     omeprazole (PRILOSEC) 40 MG capsule Take 40 mg by mouth daily.     pantoprazole (PROTONIX) 40 MG tablet Take 40 mg by mouth daily.     Current Facility-Administered Medications  Medication Dose Route Frequency Provider Last Rate Last Admin   0.9 %  sodium chloride infusion  500 mL Intravenous Once Mansouraty, Telford Nab., MD        Current Outpatient Medications:    ferrous sulfate 325 (65 FE) MG tablet, Take 325 mg by mouth daily with breakfast., Disp: , Rfl:    lisinopril (ZESTRIL) 20 MG tablet, Take 20 mg by mouth daily., Disp: , Rfl:    omeprazole (PRILOSEC) 40 MG capsule, Take 40 mg by mouth daily., Disp: , Rfl:    pantoprazole (PROTONIX) 40 MG tablet, Take 40 mg by mouth daily., Disp: , Rfl:   Current Facility-Administered Medications:    0.9 %  sodium chloride infusion, 500 mL, Intravenous, Once, Mansouraty, Telford Nab., MD No Known Allergies Family History  Problem Relation Age of Onset   Heart disease Neg Hx    Colon cancer Neg Hx    Diabetes Neg Hx    Colon polyps Neg Hx    Rectal cancer Neg Hx    Stomach cancer Neg Hx    Hypertension Mother    Social History    Socioeconomic History   Marital status: Single    Spouse name: Not on file   Number of children: 1   Years of education: Not on file   Highest education level: Not on file  Occupational History   Not on file  Tobacco Use   Smoking status: Former   Smokeless tobacco: Never  Vaping Use   Vaping Use: Never used  Substance and Sexual Activity   Alcohol use: Yes    Alcohol/week: 0.0 standard drinks of alcohol    Comment: occasionally    Drug use: No   Sexual activity: Not on file  Other Topics Concern   Not on file  Social History Narrative   ** Merged History Encounter **       Social Determinants of Health   Financial Resource Strain: Not on file  Food Insecurity: Not on file  Transportation Needs: Not on file  Physical Activity: Not on file  Stress: Not on file  Social Connections: Not on file  Intimate Partner Violence: Not on file    Physical Exam: Today's Vitals   06/05/22 1406  BP: 126/75  Pulse: 84  Temp: 98.7 F (37.1 C)  TempSrc: Skin  SpO2: 98%  Weight: 171 lb (77.6 kg)  Height: 5' (1.524  m)   Body mass index is 33.4 kg/m. GEN: NAD EYE: Sclerae anicteric ENT: MMM CV: Non-tachycardic GI: Soft, NT/ND NEURO:  Alert & Oriented x 3  Lab Results: No results for input(s): "WBC", "HGB", "HCT", "PLT" in the last 72 hours. BMET No results for input(s): "NA", "K", "CL", "CO2", "GLUCOSE", "BUN", "CREATININE", "CALCIUM" in the last 72 hours. LFT No results for input(s): "PROT", "ALBUMIN", "AST", "ALT", "ALKPHOS", "BILITOT", "BILIDIR", "IBILI" in the last 72 hours. PT/INR No results for input(s): "LABPROT", "INR" in the last 72 hours.   Impression / Plan: This is a 42 y.o.female who presents for EGD/Colonoscopy for evaluation of IDA and GERD.  The risks and benefits of endoscopic evaluation/treatment were discussed with the patient and/or family; these include but are not limited to the risk of perforation, infection, bleeding, missed lesions, lack of  diagnosis, severe illness requiring hospitalization, as well as anesthesia and sedation related illnesses.  The patient's history has been reviewed, patient examined, no change in status, and deemed stable for procedure.  The patient and/or family is agreeable to proceed.    Justice Britain, MD [facility] Office # [phone]

## 2022-06-05 NOTE — Progress Notes (Unsigned)
Interpreter is Surveyor, minerals.

## 2022-06-05 NOTE — Progress Notes (Unsigned)
A and O x3. Report to RN. Tolerated MAC anesthesia well.Teeth unchanged after procedure. 

## 2022-06-05 NOTE — Op Note (Signed)
[facility] Patient Name: Tracy Petty Procedure Date: 06/05/2022 3:09 PM MRN: [id number] Endoscopist: Corliss Parish , MD, [id number] Age: 42 Referring MD:  Date of Birth: December 09, 1980 Gender: Female Account #: [id number] Procedure:                Colonoscopy Indications:              Iron deficiency anemia Medicines:                Monitored Anesthesia Care Procedure:                Pre-Anesthesia Assessment:                           - Prior to the procedure, a History and Physical                            was performed, and patient medications and                            allergies were reviewed. The patient's tolerance of                            previous anesthesia was also reviewed. The risks                            and benefits of the procedure and the sedation                            options and risks were discussed with the patient.                            All questions were answered, and informed consent                            was obtained. Prior Anticoagulants: The patient has                            taken no anticoagulant or antiplatelet agents. ASA                            Grade Assessment: II - A patient with mild systemic                            disease. After reviewing the risks and benefits,                            the patient was deemed in satisfactory condition to                            undergo the procedure.                           After obtaining informed consent, the colonoscope  was passed under direct vision. Throughout the                            procedure, the patient's blood pressure, pulse, and                            oxygen saturations were monitored continuously. The                            Olympus CF-HQ190L SN [id number] was introduced through                            the anus and advanced to the 3 cm into the ileum.                            The colonoscopy was  performed without difficulty.                            The patient tolerated the procedure. The quality of                            the bowel preparation was good. The terminal ileum,                            ileocecal valve, appendiceal orifice, and rectum                            were photographed. Scope In: 3:38:47 PM Scope Out: 3:49:30 PM Scope Withdrawal Time: 0 hours 7 minutes 57 seconds  Total Procedure Duration: 0 hours 10 minutes 43 seconds  Findings:                 The digital rectal exam findings include                            hemorrhoids. Pertinent negatives include no                            palpable rectal lesions.                           The terminal ileum and ileocecal valve appeared                            normal.                           Normal mucosa was found in the entire colon.                           Non-bleeding non-thrombosed internal hemorrhoids                            were found during retroflexion, during perianal  exam and during digital exam. The hemorrhoids were                            Grade II (internal hemorrhoids that prolapse but                            reduce spontaneously). Complications:            No immediate complications. Estimated Blood Loss:     Estimated blood loss: none. Impression:               - Hemorrhoids found on digital rectal exam.                           - The examined portion of the ileum was normal.                           - Normal mucosa in the entire examined colon.                           - Non-bleeding non-thrombosed internal hemorrhoids. Recommendation:           - The patient will be observed post-procedure,                            until all discharge criteria are met.                           - Discharge patient to home.                           - Patient has a contact number available for                            emergencies. The signs and symptoms of  potential                            delayed complications were discussed with the                            patient. Return to normal activities tomorrow.                            Written discharge instructions were provided to the                            patient.                           - High fiber diet.                           - Use FiberCon 1-2 tablets PO daily.                           - Continue present medications.                           -  Repeat colonoscopy in 10 years for screening                            purposes.                           - Pending patient in the future, still having                            evidence of Iron Deficiency, will need to consider                            Video Capsule Endoscopy.                           - The findings and recommendations were discussed                            with the patient.                           - The findings and recommendations were discussed                            with the designated responsible adult. Justice Britain, MD 06/05/2022 4:01:09 PM

## 2022-06-05 NOTE — Progress Notes (Unsigned)
Pt's states no medical or surgical changes since previsit or office visit. VS assessed by N.C ?

## 2022-06-08 ENCOUNTER — Telehealth: Payer: Self-pay

## 2022-06-08 NOTE — Telephone Encounter (Signed)
  Follow up Call-     06/05/2022    2:06 PM  Call back number  Post procedure Call Back phone  # [phone]  Permission to leave phone message Yes     Patient questions:  Do you have a fever, pain , or abdominal swelling? No. Pain Score  0 *  Have you tolerated food without any problems? Yes.    Have you been able to return to your normal activities? Yes.    Do you have any questions about your discharge instructions: Diet   No. Medications  No. Follow up visit  No.  Do you have questions or concerns about your Care? No.  Actions: * If pain score is 4 or above: No action needed, pain <4.  INTERPRETER USEDVerdis Frederickson ID# [phone]

## 2022-06-19 ENCOUNTER — Other Ambulatory Visit: Payer: Self-pay

## 2022-06-19 ENCOUNTER — Encounter: Payer: Self-pay | Admitting: Gastroenterology

## 2022-06-19 DIAGNOSIS — R1010 Upper abdominal pain, unspecified: Secondary | ICD-10-CM

## 2022-06-19 DIAGNOSIS — D509 Iron deficiency anemia, unspecified: Secondary | ICD-10-CM

## 2023-01-21 ENCOUNTER — Encounter: Payer: Self-pay | Admitting: Gastroenterology

## 2023-02-09 ENCOUNTER — Ambulatory Visit (INDEPENDENT_AMBULATORY_CARE_PROVIDER_SITE_OTHER): Payer: Self-pay | Admitting: Gastroenterology

## 2023-02-09 ENCOUNTER — Other Ambulatory Visit (INDEPENDENT_AMBULATORY_CARE_PROVIDER_SITE_OTHER): Payer: Self-pay

## 2023-02-09 ENCOUNTER — Encounter: Payer: Self-pay | Admitting: Gastroenterology

## 2023-02-09 VITALS — BP 130/78 | HR 79 | Ht 60.0 in | Wt 186.6 lb

## 2023-02-09 DIAGNOSIS — D509 Iron deficiency anemia, unspecified: Secondary | ICD-10-CM

## 2023-02-09 DIAGNOSIS — R1010 Upper abdominal pain, unspecified: Secondary | ICD-10-CM

## 2023-02-09 DIAGNOSIS — K3189 Other diseases of stomach and duodenum: Secondary | ICD-10-CM

## 2023-02-09 LAB — IBC + FERRITIN
Ferritin: 28.9 ng/mL (ref 10.0–291.0)
Iron: 76 ug/dL (ref 42–145)
Saturation Ratios: 14.8 % — ABNORMAL LOW (ref 20.0–50.0)
TIBC: 515.2 ug/dL — ABNORMAL HIGH (ref 250.0–450.0)
Transferrin: 368 mg/dL — ABNORMAL HIGH (ref 212.0–360.0)

## 2023-02-09 LAB — CBC WITH DIFFERENTIAL/PLATELET
Basophils Absolute: 0.1 10*3/uL (ref 0.0–0.1)
Basophils Relative: 1.2 % (ref 0.0–3.0)
Eosinophils Absolute: 0.2 10*3/uL (ref 0.0–0.7)
Eosinophils Relative: 2.1 % (ref 0.0–5.0)
HCT: 41.6 % (ref 36.0–46.0)
Hemoglobin: 13.6 g/dL (ref 12.0–15.0)
Lymphocytes Relative: 28.5 % (ref 12.0–46.0)
Lymphs Abs: 2.8 10*3/uL (ref 0.7–4.0)
MCHC: 32.8 g/dL (ref 30.0–36.0)
MCV: 88.6 fL (ref 78.0–100.0)
Monocytes Absolute: 0.7 10*3/uL (ref 0.1–1.0)
Monocytes Relative: 6.9 % (ref 3.0–12.0)
Neutro Abs: 6 10*3/uL (ref 1.4–7.7)
Neutrophils Relative %: 61.3 % (ref 43.0–77.0)
Platelets: 307 10*3/uL (ref 150.0–400.0)
RBC: 4.69 Mil/uL (ref 3.87–5.11)
RDW: 13.3 % (ref 11.5–15.5)
WBC: 9.7 10*3/uL (ref 4.0–10.5)

## 2023-02-09 NOTE — Progress Notes (Signed)
02/09/2023 Tracy Petty [id number] 12-Jan-1981   HISTORY OF PRESENT ILLNESS: This is a 42 year old female who was seen by our team earlier this year and evaluated for iron deficiency anemia.  She is here today for follow-up of all that.  Underwent EGD and colonoscopy in February.  EGD 06/2022:  - No gross lesions in the entire esophagus. - Z- line irregular, 38 cm from the incisors. - A single subepithelial papule ( nodule) found in the antrum of the stomach. Tunnel biopsied. - Erythematous mucosa in the stomach. Biopsied. - No gross lesions in the duodenal bulb, in the first portion of the duodenum and in the second portion of the duodenum. Biopsied.  1. Surgical [P], gastric antrum and gastric body REACTIVE GASTROPATHY AND MILD CHRONIC GASTRITIS WITH LYMPHOID AGGREGATE NEGATIVE FOR H. PYLORI, INTESTINAL METAPLASIA, DYSPLASIA AND CARCINOMA 2. 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 Surgical [P], gastric antral SEL REACTIVE GASTROPATHY WITH FOVEOLAR HYPERPLASIA AND MILD CHRONIC GASTRITIS NEGATIVE FOR H. PYLORI, INTESTINAL METAPLASIA, DYSPLASIA AND CARCINOMA.  Plan was for EUS 12 months, so August-February to further evaluate the gastric nodule seen on EGD, biopsies did not provide a diagnosis.  Colonoscopy 06/2022 showed only nonbleeding nonthrombosed internal hemorrhoids.  She feels well.  No reports of black or bloody stools.  Still having menstrual periods.  Is not currently on her iron supplementation for the past few months or so.  Past Medical History:  Diagnosis Date   Allergy    spring   H. pylori infection    High cholesterol    Hypertension    Hypertriglyceridemia    Past Surgical History:  Procedure Laterality Date   CHOLECYSTECTOMY N/A 07/18/2017   Procedure: LAPAROSCOPIC CHOLECYSTECTOMY;  Surgeon: Kinsinger, De Blanch, MD;  Location: [facility];  Service: General;   Laterality: N/A;   NSVD     x1    reports that she has quit smoking. She has never used smokeless tobacco. She reports current alcohol use. She reports that she does not use drugs. family history includes Hypertension in her mother. No Known Allergies    Outpatient Encounter Medications as of 02/09/2023  Medication Sig   lisinopril (ZESTRIL) 20 MG tablet Take 20 mg by mouth daily.   pantoprazole (PROTONIX) 40 MG tablet Take 40 mg by mouth daily.   ferrous sulfate 325 (65 FE) MG tablet Take 325 mg by mouth daily with breakfast. (Patient not taking: Reported on 02/09/2023)   [DISCONTINUED] omeprazole (PRILOSEC) 40 MG capsule Take 40 mg by mouth daily.   No facility-administered encounter medications on file as of 02/09/2023.    REVIEW OF SYSTEMS  : All other systems reviewed and negative except where noted in the History of Present Illness.   PHYSICAL EXAM: BP 130/78   Pulse 79   Ht 5' (1.524 m)   Wt 186 lb 9.6 oz (84.6 kg)   BMI 36.44 kg/m  General: Well developed female in no acute distress Head: Normocephalic and atraumatic Eyes:  Sclerae anicteric, conjunctiva pink. Ears: Normal auditory acuity Lungs: Clear throughout to auscultation; no W/R/R. Heart: Regular rate and rhythm; no M/R/G. Abdomen: Soft, non-distended.  BS present.  Non-tender. Musculoskeletal: Symmetrical with no gross deformities  Skin: No lesions on visible extremities Extremities: No edema  Neurological: Alert oriented x 4, grossly non-focal Psychological:  Alert and cooperative. Normal mood and affect  ASSESSMENT AND PLAN: *Iron deficiency anemia: This was previously an issue, but most  recent hemoglobin in January of this year was 13.5 g.  Was supposed to have repeat labs again and May, CBC and iron studies per Dr. Meridee Score.  She will have those drawn today.  If still with significant iron deficiency/anemia then consider VCE.  Not on her iron supplements currently, stopped them a few months or so  ago. *Gastric nodule: Will plan for EUS with Dr. Meridee Score for further evaluation since biopsies from EGD in February were nondiagnostic.   CC:  Mechele Dawley, PA-C

## 2023-02-09 NOTE — Patient Instructions (Addendum)
Su proveedor le ha solicitado que vaya al stano para Education officer, environmental anlisis de laboratorio antes de irse hoy. Presione "B" en el ascensor. El laboratorio est ubicado en la primera puerta a la izquierda al salir del Medical sales representative.   Your provider has requested that you go to the basement level for lab work before leaving today. Press "B" on the elevator. The lab is located at the first door on the left as you exit the elevator.   _______________________________________________________  If your blood pressure at your visit was 140/90 or greater, please contact your primary care physician to follow up on this.  _______________________________________________________  If you are age 43 or older, your body mass index should be between 23-30. Your Body mass index is 36.44 kg/m. If this is out of the aforementioned range listed, please consider follow up with your Primary Care Provider.  If you are age 65 or younger, your body mass index should be between 19-25. Your Body mass index is 36.44 kg/m. If this is out of the aformentioned range listed, please consider follow up with your Primary Care Provider.   ________________________________________________________  The [facility] providers would like to encourage you to use [facility] to communicate with providers for non-urgent requests or questions.  Due to long hold times on the telephone, sending your provider a message by [facility] may be a faster and more efficient way to get a response.  Please allow 48 business hours for a response.  Please remember that this is for non-urgent requests.  _______________________________________________________

## 2023-02-12 ENCOUNTER — Other Ambulatory Visit: Payer: Self-pay

## 2023-02-12 DIAGNOSIS — D509 Iron deficiency anemia, unspecified: Secondary | ICD-10-CM

## 2023-02-12 MED ORDER — FERROUS SULFATE 325 (65 FE) MG PO TABS: 325.0000 mg | ORAL_TABLET | Freq: Every day | ORAL | 6 refills | Status: AC

## 2023-02-17 ENCOUNTER — Encounter: Payer: Self-pay | Admitting: Gastroenterology

## 2023-02-17 DIAGNOSIS — D509 Iron deficiency anemia, unspecified: Secondary | ICD-10-CM | POA: Insufficient documentation

## 2023-02-17 DIAGNOSIS — K3189 Other diseases of stomach and duodenum: Secondary | ICD-10-CM | POA: Insufficient documentation

## 2023-02-19 NOTE — Progress Notes (Signed)
Attending Physician's Attestation   I have reviewed the chart.   I agree with the Advanced Practitioner's note, impression, and recommendations with any updates as below. Will place my team on here to ensure that the recall is in the system.   Corliss Parish, MD [facility] Office # [phone]

## 2023-04-05 ENCOUNTER — Encounter (HOSPITAL_COMMUNITY): Payer: Self-pay | Admitting: Gastroenterology

## 2023-04-05 NOTE — Progress Notes (Signed)
**  Using Interpretive services.Attempted to obtain medical history for pre op call via telephone, unable to reach at this time. HIPAA compliant voicemail message left requesting return call to pre surgical testing department.

## 2023-04-12 ENCOUNTER — Encounter (HOSPITAL_COMMUNITY)
Admission: RE | Disposition: A | Discharge status: Home / Self Care | Payer: Self-pay | Source: Home / Self Care | Attending: Gastroenterology

## 2023-04-12 ENCOUNTER — Ambulatory Visit (HOSPITAL_BASED_OUTPATIENT_CLINIC_OR_DEPARTMENT_OTHER): Payer: Self-pay | Admitting: Anesthesiology

## 2023-04-12 ENCOUNTER — Ambulatory Visit (HOSPITAL_COMMUNITY): Payer: Self-pay | Admitting: Anesthesiology

## 2023-04-12 ENCOUNTER — Encounter (HOSPITAL_COMMUNITY): Payer: Self-pay | Admitting: Gastroenterology

## 2023-04-12 ENCOUNTER — Other Ambulatory Visit: Payer: Self-pay

## 2023-04-12 ENCOUNTER — Ambulatory Visit (HOSPITAL_COMMUNITY)
Admission: RE | Admit: 2023-04-12 | Discharge: 2023-04-12 | Disposition: A | Discharge status: Home / Self Care | Payer: Self-pay | Attending: Gastroenterology | Admitting: Gastroenterology

## 2023-04-12 DIAGNOSIS — K317 Polyp of stomach and duodenum: Secondary | ICD-10-CM | POA: Insufficient documentation

## 2023-04-12 DIAGNOSIS — K219 Gastro-esophageal reflux disease without esophagitis: Secondary | ICD-10-CM | POA: Insufficient documentation

## 2023-04-12 DIAGNOSIS — K2289 Other specified disease of esophagus: Secondary | ICD-10-CM | POA: Insufficient documentation

## 2023-04-12 DIAGNOSIS — K3189 Other diseases of stomach and duodenum: Secondary | ICD-10-CM | POA: Insufficient documentation

## 2023-04-12 DIAGNOSIS — I10 Essential (primary) hypertension: Secondary | ICD-10-CM | POA: Insufficient documentation

## 2023-04-12 DIAGNOSIS — Z87891 Personal history of nicotine dependence: Secondary | ICD-10-CM | POA: Insufficient documentation

## 2023-04-12 DIAGNOSIS — I899 Noninfective disorder of lymphatic vessels and lymph nodes, unspecified: Secondary | ICD-10-CM

## 2023-04-12 HISTORY — PX: ESOPHAGOGASTRODUODENOSCOPY: SHX5428

## 2023-04-12 HISTORY — PX: UPPER ESOPHAGEAL ENDOSCOPIC ULTRASOUND (EUS): SHX6562

## 2023-04-12 HISTORY — PX: HEMOSTASIS CLIP PLACEMENT: SHX6857

## 2023-04-12 HISTORY — PX: REMOVAL OF STONES: SHX5545

## 2023-04-12 HISTORY — PX: SUBMUCOSAL LIFTING INJECTION: SHX6855

## 2023-04-12 SURGERY — UPPER ESOPHAGEAL ENDOSCOPIC ULTRASOUND (EUS)
Anesthesia: Monitor Anesthesia Care

## 2023-04-12 MED ORDER — SUCRALFATE 1 G PO TABS: 1.0000 g | ORAL_TABLET | Freq: Two times a day (BID) | ORAL | 0 refills | Status: AC

## 2023-04-12 MED ORDER — PROPOFOL 500 MG/50ML IV EMUL: INTRAVENOUS | Status: DC | PRN

## 2023-04-12 MED ORDER — PROPOFOL 500 MG/50ML IV EMUL
INTRAVENOUS | Status: DC | PRN
  Administered 2023-04-12: 150 ug/kg/min via INTRAVENOUS

## 2023-04-12 MED ORDER — PROPOFOL 1000 MG/100ML IV EMUL
INTRAVENOUS | Status: AC
  Filled 2023-04-12: qty 100

## 2023-04-12 MED ORDER — SODIUM CHLORIDE 0.9 % IV SOLN: INTRAVENOUS | Status: DC

## 2023-04-12 MED ORDER — PANTOPRAZOLE SODIUM 40 MG PO TBEC: 40.0000 mg | DELAYED_RELEASE_TABLET | Freq: Two times a day (BID) | ORAL | 6 refills | Status: AC

## 2023-04-12 MED ORDER — PHENYLEPHRINE 80 MCG/ML (10ML) SYRINGE FOR IV PUSH (FOR BLOOD PRESSURE SUPPORT)
PREFILLED_SYRINGE | INTRAVENOUS | Status: DC | PRN
  Administered 2023-04-12: 80 ug via INTRAVENOUS

## 2023-04-12 NOTE — Anesthesia Postprocedure Evaluation (Signed)
Anesthesia Post Note  Patient: Tracy Petty  Procedure(s) Performed: UPPER ESOPHAGEAL ENDOSCOPIC ULTRASOUND (EUS) SUBMUCOSAL LIFTING INJECTION Gastric Nodule BANDING ESOPHAGOGASTRODUODENOSCOPY (EGD) REMOVAL OF NODULE HEMOSTASIS CLIP PLACEMENT     Patient location during evaluation: Endoscopy Anesthesia Type: MAC Level of consciousness: awake and alert Pain management: pain level controlled Vital Signs Assessment: post-procedure vital signs reviewed and stable Respiratory status: spontaneous breathing, nonlabored ventilation, respiratory function stable and patient connected to nasal cannula oxygen Cardiovascular status: stable and blood pressure returned to baseline Postop Assessment: no apparent nausea or vomiting Anesthetic complications: no   No notable events documented.  Last Vitals:  Vitals:   04/12/23 1420 04/12/23 1430  BP: 112/73 127/88  Pulse: 61 64  Resp: 14 (!) 8  Temp:    SpO2: 100% 100%    Last Pain:  Vitals:   04/12/23 1420  TempSrc:   PainSc: 0-No pain                 Earl Lites P Asher Torpey

## 2023-04-12 NOTE — Op Note (Signed)
[facility] Patient Name: Tracy Petty Procedure Date: 04/12/2023 MRN: [id number] Attending MD: Corliss Parish , MD, [id number] Date of Birth: 1980/10/02 CSN: [id number] Age: 42 Admit Type: Inpatient Procedure:                Upper EUS Indications:              Gastric deformity on endoscopy/Subepithelial tumor                            versus extrinsic compression Providers:                Corliss Parish, MD, Eliberto Ivory, RN, Doristine Mango, RN, Kandice Robinsons, Technician Referring MD:              Medicines:                Monitored Anesthesia Care Complications:            No immediate complications. Estimated Blood Loss:     Estimated blood loss: none. Procedure:                Pre-Anesthesia Assessment:                           - Prior to the procedure, a History and Physical                            was performed, and patient medications and                            allergies were reviewed. The patient's tolerance of                            previous anesthesia was also reviewed. The risks                            and benefits of the procedure and the sedation                            options and risks were discussed with the patient.                            All questions were answered, and informed consent                            was obtained. Prior Anticoagulants: The patient has                            taken no anticoagulant or antiplatelet agents. ASA                            Grade Assessment: II - A patient with mild systemic  disease. After reviewing the risks and benefits,                            the patient was deemed in satisfactory condition to                            undergo the procedure.                           After obtaining informed consent, the endoscope was                            passed under direct vision. Throughout the                             procedure, the patient's blood pressure, pulse, and                            oxygen saturations were monitored continuously. The                            [id number]-[id number] ([id number]) Olympus therapeutic endoscope                            was introduced through the mouth, and advanced to                            the second part of duodenum. The [id number]-[id number]                            ([id number]) Olympus radial ultrasound scope was                            introduced through the mouth, and advanced to the                            stomach for ultrasound examination. The upper EUS                            was accomplished without difficulty. The patient                            tolerated the procedure. Scope In: Scope Out: Findings:      ENDOSCOPIC FINDING: :      No gross lesions were noted in the entire esophagus.      The Z-line was irregular and was found 37 cm from the incisors.      A single 8 mm subepithelial papule (nodule) with no bleeding and no       stigmata of recent bleeding was found in the gastric antrum. After the       EUS was completed, preparations were made for mucosal resection.       Demarcation of the lesion was performed with high-definition white light       and narrow band imaging to clearly identify the boundaries of the  lesion. EverLift was injected to raise the lesion. CAP-Band-ligator and       snare mucosal resection was performed. Resection and retrieval were       complete. Resected tissue margins were examined and clear of polyp       tissue. To prevent bleeding after mucosal resection, three hemostatic       clips were successfully placed (MR conditional). Clip manufacturer:       [employer]. There was no bleeding during, or at the end, of the       procedure.      No other gross lesions were noted in the entire examined stomach.      No gross lesions were noted in the duodenal bulb, in the first portion       of the duodenum  and in the second portion of the duodenum.      ENDOSONOGRAPHIC FINDING: :      A round intramural (subepithelial) lesion was found in the antrum of the       stomach. The lesion was homogenous. Sonographically, the lesion appeared       to originate from the luminal interface/superficial mucosa (Layer 1) and       deep mucosa (Layer 2). The lesion measured 7.0 mm (in maximum       thickness). The lesion also measured 6.2 mm in diameter. The outer       endosonographic borders were well defined.      Endosonographic images of the stomach were unremarkable. No masses and       no wall thickening were identified.      No malignant-appearing lymph nodes were visualized in the gastrohepatic       ligament (level 18), celiac region (level 20) and perigastric region.      Endosonographic imaging in the visualized portion of the liver showed no       mass.      The celiac region was visualized. Impression:               EGD impression:                           - No gross lesions in the entire esophagus. Z-line                            irregular, 37 cm from the incisors.                           - A single subepithelial papule (nodule) found in                            the stomach. After EUS suggested that this did not                            involve layer 4 or deep submucosa, decision was                            made to resect. Complete removal was accomplished                            with CAP-Band-Ligator mucosal resection. Clips (MR  conditional) were placed. Clip manufacturer: [employer].                           - No other gross lesions in the entire stomach.                           - No gross lesions in the duodenal bulb, in the                            first portion of the duodenum and in the second                            portion of the duodenum.                           EUS impression:                            - An intramural (subepithelial) lesion was found in                            the antrum of the stomach. The lesion appeared to                            originate from within the luminal                            interface/superficial mucosa (Layer 1) and deep                            mucosa (Layer 2). Tissue has not been obtained.                            However, the endosonographic appearance is                            suggestive of benign inflammatory changes versus                            inflammatory polyp (more so than other lesions).                           - Endosonographic images of the stomach were                            unremarkable.                           - No malignant-appearing lymph nodes were                            visualized in the gastrohepatic ligament (level  18), celiac region (level 20) and perigastric                            region. Moderate Sedation:      Not Applicable - Patient had care per Anesthesia. Recommendation:           - The patient will be observed post-procedure,                            until all discharge criteria are met.                           - Discharge patient to home.                           - Patient has a contact number available for                            emergencies. The signs and symptoms of potential                            delayed complications were discussed with the                            patient. Return to normal activities tomorrow.                            Written discharge instructions were provided to the                            patient.                           - Full liquid diet rest of today and advance diet                            as tolerated.                           - Pantoprazole 40 mg twice daily for 1 month then                            back to once daily.                           - Carafate 1 g twice daily for 2 weeks then  may                            stop.                           - Continue present medications otherwise.                           - Observe patient's clinical course.                           -  Follow-up pathology.                           - Pending final pathology, will determine if this                            requires any additional surveillance or monitoring                            of the area.                           - The findings and recommendations were discussed                            with the patient.                           - The findings and recommendations were discussed                            with the designated responsible adult. Procedure Code(s):        --- Professional ---                           [phone], Esophagogastroduodenoscopy, flexible,                            transoral; with endoscopic mucosal resection                           43237, Esophagogastroduodenoscopy, flexible,                            transoral; with endoscopic ultrasound examination                            limited to the esophagus, stomach or duodenum, and                            adjacent structures Diagnosis Code(s):        --- Professional ---                           K22.89, Other specified disease of esophagus                           K31.89, Other diseases of stomach and duodenum                           I89.9, Noninfective disorder of lymphatic vessels                            and lymph nodes, unspecified CPT copyright 2022 American Medical Association. All rights reserved. The codes documented in this report are preliminary and upon coder review may  be revised to meet current compliance requirements. Corliss Parish, MD 04/12/2023 1:43:45  PM Number of Addenda: 0

## 2023-04-12 NOTE — Anesthesia Preprocedure Evaluation (Signed)
Anesthesia Evaluation  Patient identified by MRN, date of birth, ID band Patient awake    Reviewed: Allergy & Precautions, NPO status , Patient's Chart, lab work & pertinent test results  Airway Mallampati: II  TM Distance: >3 FB Neck ROM: Full    Dental no notable dental hx.    Pulmonary neg pulmonary ROS, former smoker   Pulmonary exam normal        Cardiovascular hypertension, Pt. on medications  Rhythm:Regular Rate:Normal     Neuro/Psych negative neurological ROS  negative psych ROS   GI/Hepatic Neg liver ROS,GERD  Medicated,,  Endo/Other  negative endocrine ROS    Renal/GU negative Renal ROS  negative genitourinary   Musculoskeletal negative musculoskeletal ROS (+)    Abdominal Normal abdominal exam  (+)   Peds  Hematology  (+) Blood dyscrasia, anemia Lab Results      Component                Value               Date                      WBC                      9.7                 02/09/2023                HGB                      13.6                02/09/2023                HCT                      41.6                02/09/2023                MCV                      88.6                02/09/2023                PLT                      307.0               02/09/2023              Anesthesia Other Findings   Reproductive/Obstetrics                             Anesthesia Physical Anesthesia Plan  ASA: 2  Anesthesia Plan: MAC   Post-op Pain Management:    Induction: Intravenous  PONV Risk Score and Plan: 2 and Propofol infusion and Treatment may vary due to age or medical condition  Airway Management Planned: Simple Face Mask and Nasal Cannula  Additional Equipment: None  Intra-op Plan:   Post-operative Plan:   Informed Consent: I have reviewed the patients History and Physical, chart, labs and discussed the procedure including the risks, benefits and alternatives  for the proposed anesthesia with the patient or  authorized representative who has indicated his/her understanding and acceptance.     Dental advisory given  Plan Discussed with: CRNA  Anesthesia Plan Comments:        Anesthesia Quick Evaluation

## 2023-04-12 NOTE — Transfer of Care (Signed)
Immediate Anesthesia Transfer of Care Note  Patient: Tracy Petty  Procedure(s) Performed: UPPER ESOPHAGEAL ENDOSCOPIC ULTRASOUND (EUS)  Patient Location: PACU  Anesthesia Type:MAC  Level of Consciousness: sedated  Airway & Oxygen Therapy: Patient Spontanous Breathing  Post-op Assessment: Report given to RN  Post vital signs: Reviewed and stable  Last Vitals:  Vitals Value Taken Time  BP 145/78 04/12/23 1340  Temp 36.1 04/12/23/1340  Pulse 81 04/12/23 1339  Resp 23 04/12/23 1339  SpO2 100 % 04/12/23 1339  Vitals shown include unfiled device data.  Last Pain:  Vitals:   04/12/23 1120  TempSrc: (P) Tympanic         Complications: No notable events documented.

## 2023-04-12 NOTE — Discharge Instructions (Addendum)
      [facility] ENDOSCOPY [address] [place], [place]  [address] Phone:  [phone]   April 12, 2023  Patient: Tracy Petty  Date of Birth: [birth year]  Date of Visit: April 12, 2023    To Whom It May Concern:  Tracy Petty was seen and treated on April 12, 2023 and will be available for work on  the afternoon of April 13, 2023   .           If you have any questions or concerns, please don't hesitate to call [phone].   Sincerely,       Treatment Team:  Attending Provider: Mansouraty, Netty Starring., MD       YOU HAD AN ENDOSCOPIC PROCEDURE TODAY: Refer to the procedure report and other information in the discharge instructions given to you for any specific questions about what was found during the examination. If this information does not answer your questions, please call [place] office at [phone] to clarify.   YOU SHOULD EXPECT: Some feelings of bloating in the abdomen. Passage of more gas than usual. Walking can help get rid of the air that was put into your GI tract during the procedure and reduce the bloating. If you had a lower endoscopy (such as a colonoscopy or flexible sigmoidoscopy) you may notice spotting of blood in your stool or on the toilet paper. Some abdominal soreness may be present for a day or two, also.  DIET: Your first meal following the procedure should be a light meal and then it is ok to progress to your normal diet. A half-sandwich or bowl of soup is an example of a good first meal. Heavy or fried foods are harder to digest and may make you feel nauseous or bloated. Drink plenty of fluids but you should avoid alcoholic beverages for 24 hours. If you had a esophageal dilation, please see attached instructions for diet.    ACTIVITY: Your care partner should take you home directly after the procedure. You should plan to take it easy, moving slowly for the rest of the day. You can  resume normal activity the day after the procedure however YOU SHOULD NOT DRIVE, use power tools, machinery or perform tasks that involve climbing or major physical exertion for 24 hours (because of the sedation medicines used during the test).   SYMPTOMS TO REPORT IMMEDIATELY: A gastroenterologist can be reached at any hour. Please call [phone]  for any of the following symptoms:  Following lower endoscopy (colonoscopy, flexible sigmoidoscopy) Excessive amounts of blood in the stool  Significant tenderness, worsening of abdominal pains  Swelling of the abdomen that is new, acute  Fever of 100 or higher  Following upper endoscopy (EGD, EUS, ERCP, esophageal dilation) Vomiting of blood or coffee ground material  New, significant abdominal pain  New, significant chest pain or pain under the shoulder blades  Painful or persistently difficult swallowing  New shortness of breath  Black, tarry-looking or red, bloody stools  FOLLOW UP:  If any biopsies were taken you will be contacted by phone or by letter within the next 1-3 weeks. Call [phone]  if you have not heard about the biopsies in 3 weeks.  Please also call with any specific questions about appointments or follow up tests.

## 2023-04-12 NOTE — H&P (Signed)
GASTROENTEROLOGY PROCEDURE H&P NOTE   Primary Care Physician: Mechele Dawley, PA-C  HPI: Tracy Petty is a 42 y.o. female who presents for EGD/EUS to follow-up gastric nodule.  Past Medical History:  Diagnosis Date   Allergy    spring   H. pylori infection    High cholesterol    Hypertension    Hypertriglyceridemia    Past Surgical History:  Procedure Laterality Date   CHOLECYSTECTOMY N/A 07/18/2017   Procedure: LAPAROSCOPIC CHOLECYSTECTOMY;  Surgeon: Rodman Pickle, MD;  Location: [facility] OR;  Service: General;  Laterality: N/A;   NSVD     x1   Current Facility-Administered Medications  Medication Dose Route Frequency Provider Last Rate Last Admin   0.9 %  sodium chloride infusion   Intravenous Continuous Zehr, Jessica D, PA-C        Current Facility-Administered Medications:    0.9 %  sodium chloride infusion, , Intravenous, Continuous, Zehr, Jessica D, PA-C No Known Allergies Family History  Problem Relation Age of Onset   Heart disease Neg Hx    Colon cancer Neg Hx    Diabetes Neg Hx    Colon polyps Neg Hx    Rectal cancer Neg Hx    Stomach cancer Neg Hx    Hypertension Mother    Social History   Socioeconomic History   Marital status: Single    Spouse name: Not on file   Number of children: 1   Years of education: Not on file   Highest education level: Not on file  Occupational History   Not on file  Tobacco Use   Smoking status: Former   Smokeless tobacco: Never  Vaping Use   Vaping status: Never Used  Substance and Sexual Activity   Alcohol use: Yes    Alcohol/week: 0.0 standard drinks of alcohol    Comment: occasionally    Drug use: No   Sexual activity: Not on file  Other Topics Concern   Not on file  Social History Narrative   ** Merged History Encounter **       Social Determinants of Health   Financial Resource Strain: Not on file  Food Insecurity: Not on file  Transportation Needs: Not on file  Physical  Activity: Not on file  Stress: Not on file  Social Connections: Not on file  Intimate Partner Violence: Not on file    Physical Exam: Today's Vitals   04/12/23 1120  Weight: 84.6 kg  Height: 5' (1.524 m)   Body mass index is 36.43 kg/m. GEN: NAD EYE: Sclerae anicteric ENT: MMM CV: Non-tachycardic GI: Soft, NT/ND NEURO:  Alert & Oriented x 3  Lab Results: No results for input(s): "WBC", "HGB", "HCT", "PLT" in the last 72 hours. BMET No results for input(s): "NA", "K", "CL", "CO2", "GLUCOSE", "BUN", "CREATININE", "CALCIUM" in the last 72 hours. LFT No results for input(s): "PROT", "ALBUMIN", "AST", "ALT", "ALKPHOS", "BILITOT", "BILIDIR", "IBILI" in the last 72 hours. PT/INR No results for input(s): "LABPROT", "INR" in the last 72 hours.   Impression / Plan: This is a 42 y.o.female who presents for EGD/EUS to follow-up gastric nodule.  The risks of an EUS including intestinal perforation, bleeding, infection, aspiration, and medication effects were discussed as was the possibility it may not give a definitive diagnosis if a biopsy is performed.  When a biopsy of the pancreas is done as part of the EUS, there is an additional risk of pancreatitis at the rate of about 1-2%.  It was explained that  procedure related pancreatitis is typically mild, although it can be severe and even life threatening, which is why we do not perform random pancreatic biopsies and only biopsy a lesion/area we feel is concerning enough to warrant the risk.   The risks and benefits of endoscopic evaluation/treatment were discussed with the patient and/or family; these include but are not limited to the risk of perforation, infection, bleeding, missed lesions, lack of diagnosis, severe illness requiring hospitalization, as well as anesthesia and sedation related illnesses.  The patient's history has been reviewed, patient examined, no change in status, and deemed stable for procedure.  The patient and/or  family is agreeable to proceed.    Tracy Parish, MD [facility] Office # [phone]

## 2023-04-14 ENCOUNTER — Encounter (HOSPITAL_COMMUNITY): Payer: Self-pay | Admitting: Gastroenterology

## 2023-04-14 LAB — SURGICAL PATHOLOGY

## 2023-04-15 ENCOUNTER — Encounter: Payer: Self-pay | Admitting: Gastroenterology

## 2023-08-13 ENCOUNTER — Telehealth: Payer: Self-pay

## 2023-08-13 NOTE — Telephone Encounter (Signed)
 The pt has  been reminded to come in for repeat labs using interpreter service

## 2023-08-13 NOTE — Telephone Encounter (Signed)
-----   Message from Nurse Corbitt Cloke P sent at 02/12/2023 10:11 AM EDT ----- Pt needs labs
